# Patient Record
Sex: Male | Born: 1960 | Race: White | Hispanic: No | Marital: Married | Smoking: Never smoker
Health system: Southern US, Community
[De-identification: ages and names within clinical notes are randomized; demographics above are authoritative.]

## PROBLEM LIST (undated history)

## (undated) DIAGNOSIS — F1010 Alcohol abuse, uncomplicated: Secondary | ICD-10-CM

## (undated) DIAGNOSIS — E785 Hyperlipidemia, unspecified: Secondary | ICD-10-CM

## (undated) DIAGNOSIS — T7840XA Allergy, unspecified, initial encounter: Secondary | ICD-10-CM

## (undated) DIAGNOSIS — K649 Unspecified hemorrhoids: Secondary | ICD-10-CM

## (undated) DIAGNOSIS — K219 Gastro-esophageal reflux disease without esophagitis: Secondary | ICD-10-CM

## (undated) DIAGNOSIS — G4733 Obstructive sleep apnea (adult) (pediatric): Secondary | ICD-10-CM

## (undated) DIAGNOSIS — D649 Anemia, unspecified: Secondary | ICD-10-CM

## (undated) DIAGNOSIS — I10 Essential (primary) hypertension: Secondary | ICD-10-CM

## (undated) DIAGNOSIS — E119 Type 2 diabetes mellitus without complications: Secondary | ICD-10-CM

## (undated) HISTORY — DX: Essential (primary) hypertension: I10

## (undated) HISTORY — DX: Allergy, unspecified, initial encounter: T78.40XA

## (undated) HISTORY — DX: Type 2 diabetes mellitus without complications: E11.9

## (undated) HISTORY — DX: Gastro-esophageal reflux disease without esophagitis: K21.9

## (undated) HISTORY — PX: EYE SURGERY: SHX253

## (undated) HISTORY — DX: Hyperlipidemia, unspecified: E78.5

## (undated) HISTORY — PX: ESOPHAGOGASTRODUODENOSCOPY: SHX1529

## (undated) HISTORY — DX: Obstructive sleep apnea (adult) (pediatric): G47.33

## (undated) HISTORY — DX: Alcohol abuse, uncomplicated: F10.10

## (undated) HISTORY — DX: Anemia, unspecified: D64.9

## (undated) SURGERY — EGD (ESOPHAGOGASTRODUODENOSCOPY)
Anesthesia: Moderate Sedation

---

## 1999-02-11 ENCOUNTER — Encounter: Payer: Self-pay | Admitting: Gastroenterology

## 1999-02-11 ENCOUNTER — Ambulatory Visit (HOSPITAL_COMMUNITY): Admission: RE | Admit: 1999-02-11 | Discharge: 1999-02-11 | Payer: Self-pay | Admitting: Gastroenterology

## 2000-01-02 ENCOUNTER — Encounter: Admission: RE | Admit: 2000-01-02 | Discharge: 2000-01-02 | Payer: Self-pay | Admitting: Family Medicine

## 2000-01-02 ENCOUNTER — Encounter: Payer: Self-pay | Admitting: Family Medicine

## 2000-12-29 DIAGNOSIS — E119 Type 2 diabetes mellitus without complications: Secondary | ICD-10-CM

## 2000-12-29 HISTORY — PX: SEPTOPLASTY: SUR1290

## 2000-12-29 HISTORY — DX: Type 2 diabetes mellitus without complications: E11.9

## 2000-12-29 HISTORY — PX: KNEE SURGERY: SHX244

## 2005-06-19 ENCOUNTER — Encounter: Admission: RE | Admit: 2005-06-19 | Discharge: 2005-06-19 | Payer: Self-pay | Admitting: Family Medicine

## 2006-01-29 IMAGING — CT CT HEAD WO/W CM
1 of 2 series · 13 of 30 positions shown, 17 images · IV contrast (omnipaque)
Comparison: none

CLINICAL DATA: Dizziness, increased heart rate.  History of head injury in Thursday April, 2005 with loss of consciousness.
 HEAD CT WITHOUT AND WITH CONTRAST:
TECHNIQUE: 5 mm collimated images were obtained from the base of the skull through the vertex according to the standard protocol before and after administration of intravenous contrast.
 Contrast:  75 cc of Omnipaque 300.
 The ventricular system is normal in size and configuration, and the septum is in a normal midline position.  The fourth ventricle and basilar cisterns appear normal.  No acute intracranial abnormality is seen.  No mass effect is noted.  After contrast administration, no enhancing lesion is seen.  On bone window images, no acute bony abnormality is noted.

[Series 2: head w/o · axial · non-contrast · 0.45mm/px · z∈[+26,+147]mm · 13 of 28 slices shown, 17 images]
[im 2/28  brain]
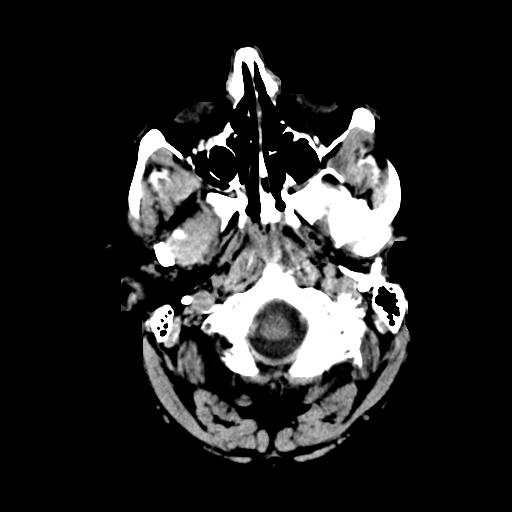
[im 2/28  bone]
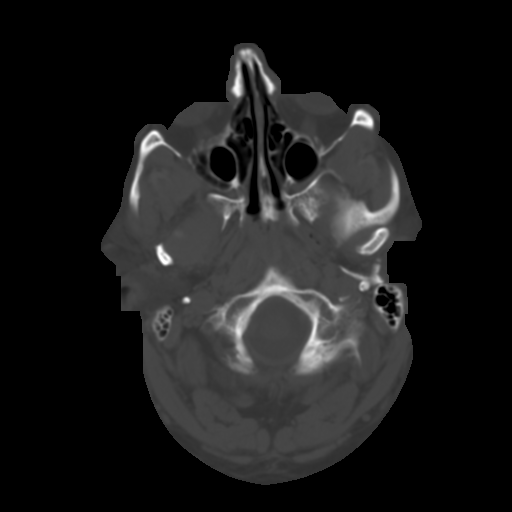
[im 4/28  brain]
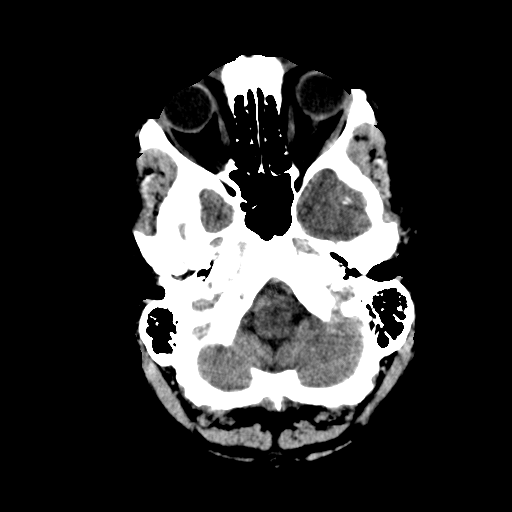
[im 6/28  brain]
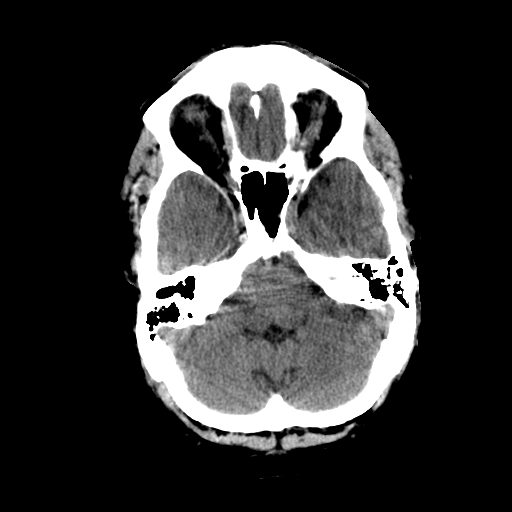
[im 8/28  brain]
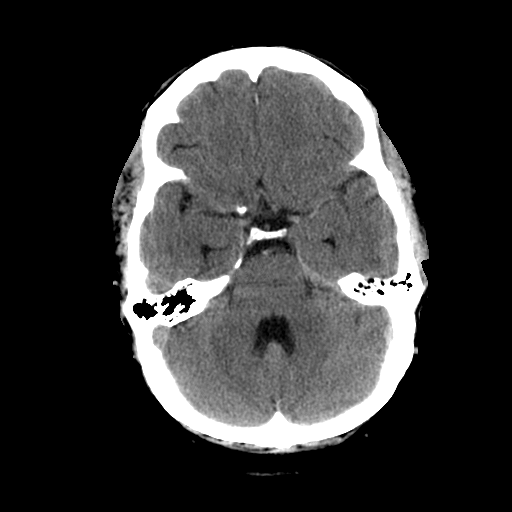
[im 10/28  brain]
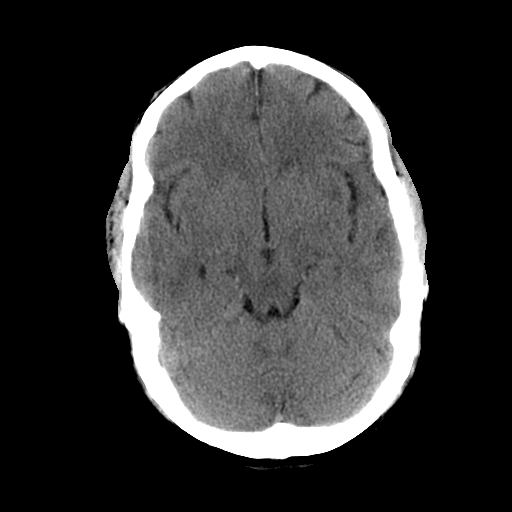
[im 10/28  bone]
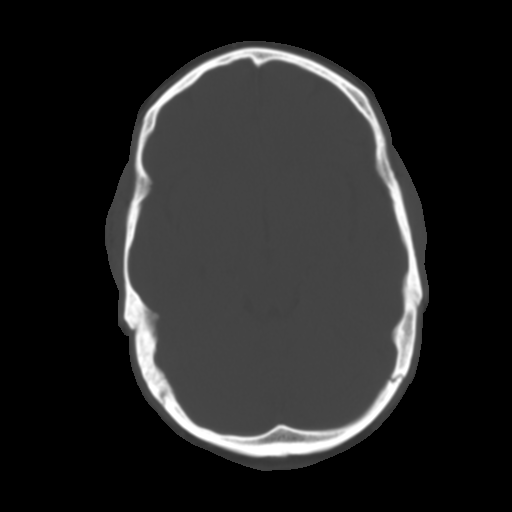
[im 12/28  brain]
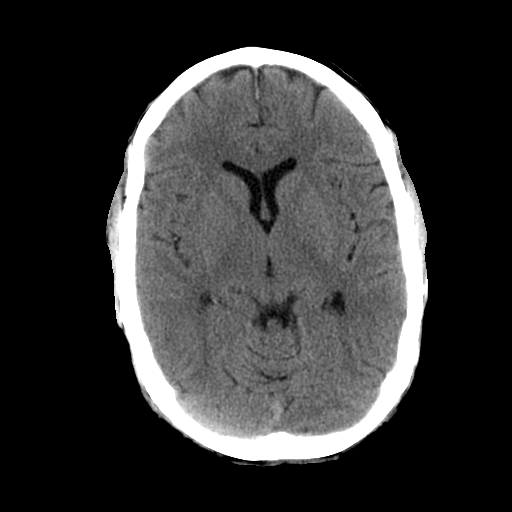
[im 14/28  brain]
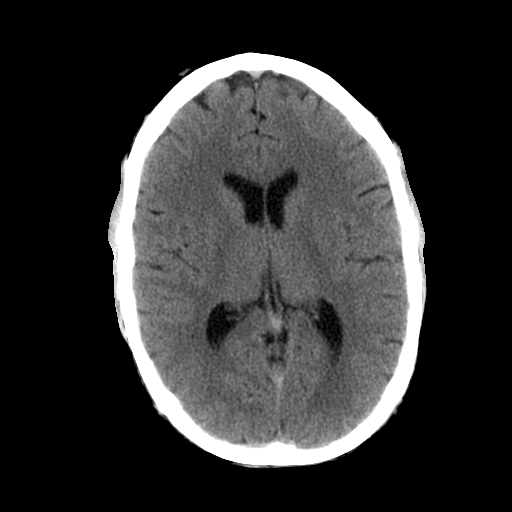
[im 16/28  brain]
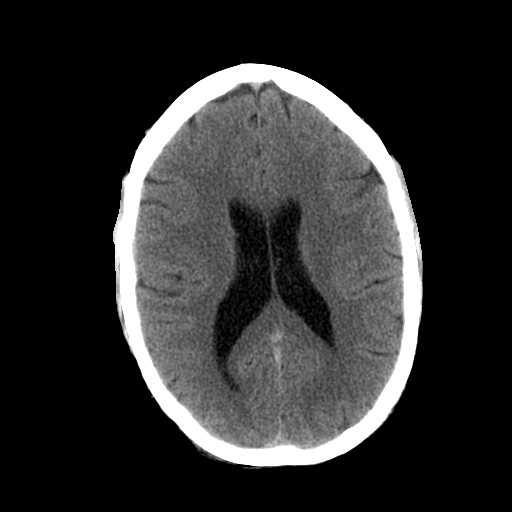
[im 18/28  brain]
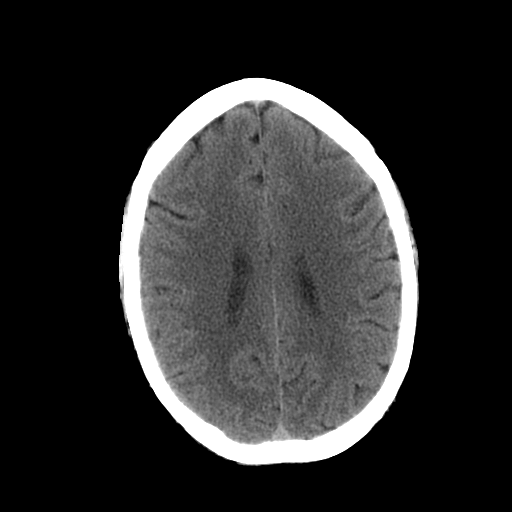
[im 18/28  bone]
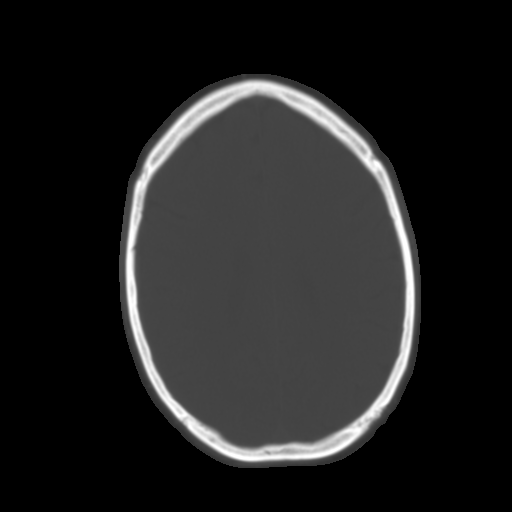
[im 20/28  brain]
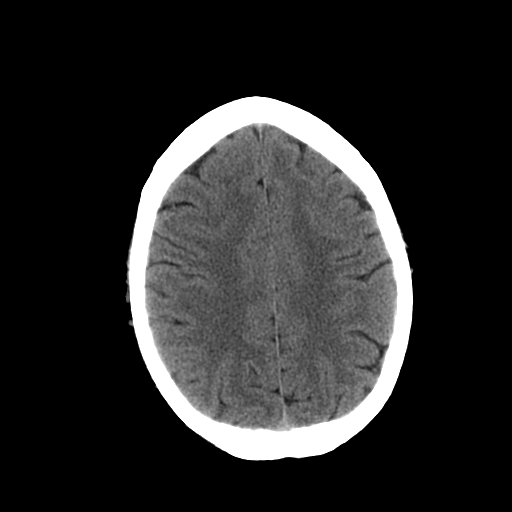
[im 22/28  brain]
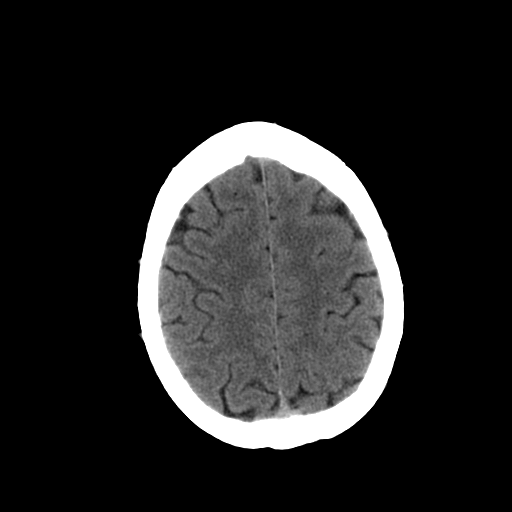
[im 24/28  brain]
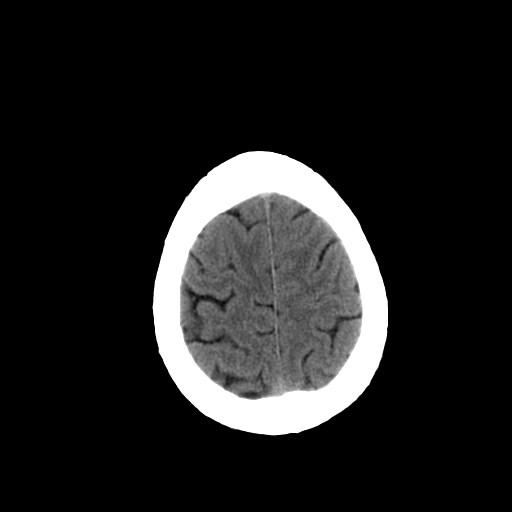
[im 26/28  brain]
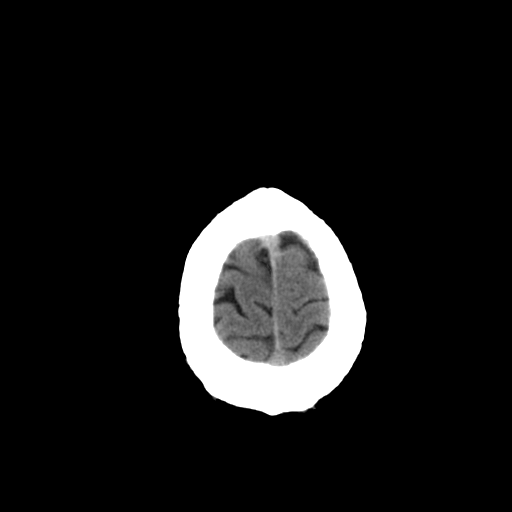
[im 26/28  bone]
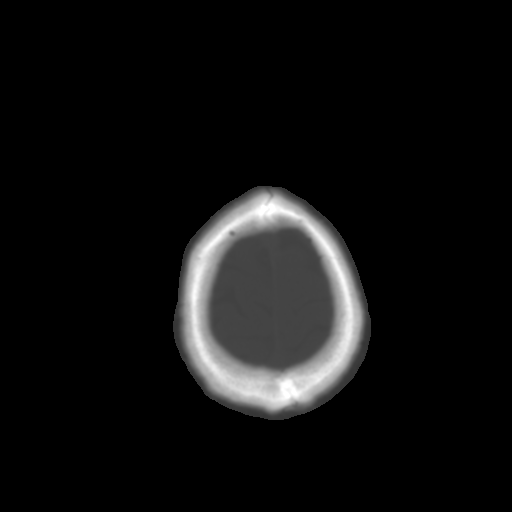

[13 of 30 positions shown; findings below may reference images not displayed]

IMPRESSION: Negative CT brain scan.

## 2008-01-11 ENCOUNTER — Encounter: Admission: RE | Admit: 2008-01-11 | Discharge: 2008-01-11 | Payer: Self-pay | Admitting: Sports Medicine

## 2008-08-22 IMAGING — RF DG FLUORO GUIDE NDL PLC/BX
1 series · 1 of 1 positions shown · IV contrast (magnevist)
Comparison: none

CLINICAL DATA: Shoulder pain.  Assess for labral pathology. 
 FLUOROSCOPICALLY GUIDED OF THE LEFT SHOULDER INJECTION FOR MR:
 The skin anterior to the left shoulder was cleansed and anesthetized.  A 22 gauge spinal needle was directed into the shoulder joint.  Intra-articular positioning was confirmed and 10 cc of fluid containing iodinated contrast, 1% Lidocaine, and a few drops of  Magnevist was injected.  The procedure was well-tolerated, and the patient was taken to MR in good condition.

[Series 2: (hospital) · 1 of 1 slices shown]
[im 1/1]
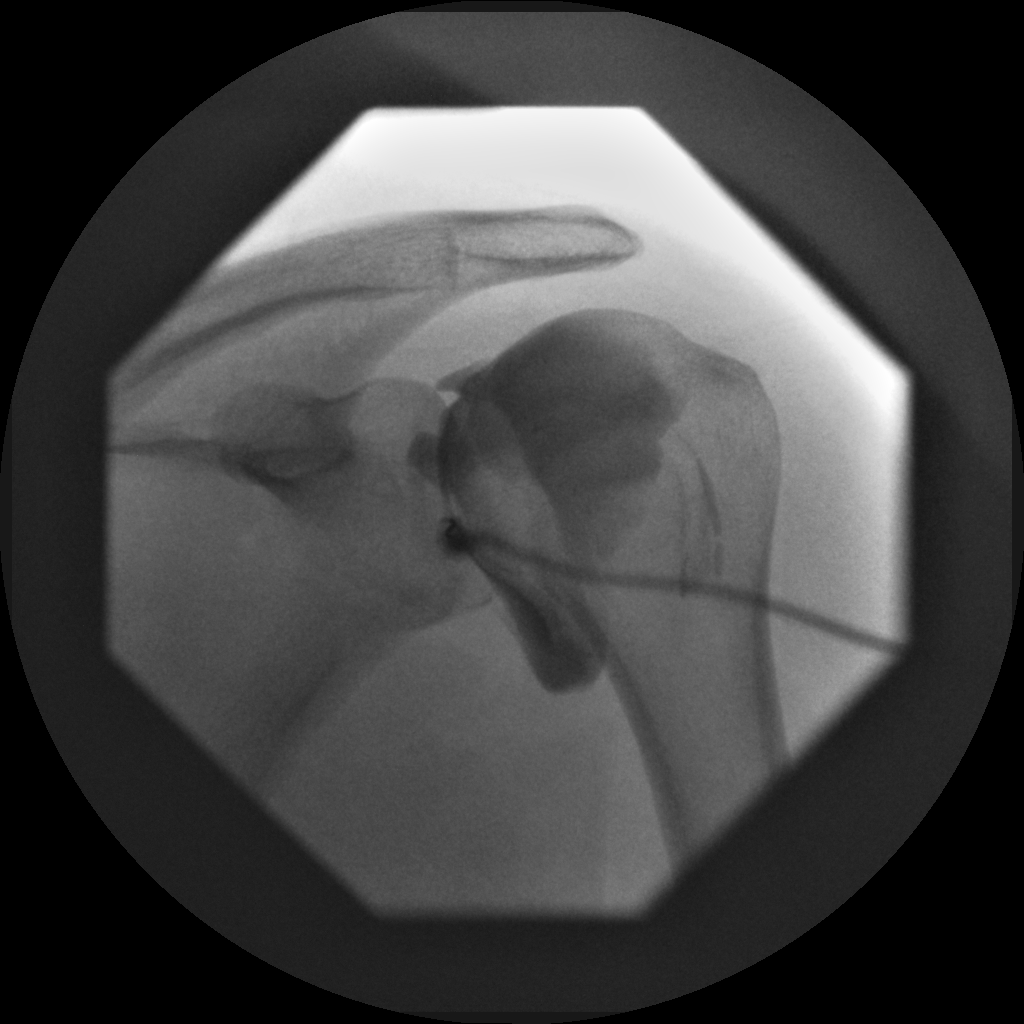

[1 of 1 positions shown; findings below may reference images not displayed]

IMPRESSION: Technically successful left shoulder injection for MRI.

## 2014-01-23 ENCOUNTER — Encounter (INDEPENDENT_AMBULATORY_CARE_PROVIDER_SITE_OTHER): Payer: Self-pay

## 2014-01-23 ENCOUNTER — Ambulatory Visit (INDEPENDENT_AMBULATORY_CARE_PROVIDER_SITE_OTHER): Payer: BC Managed Care – PPO | Admitting: General Surgery

## 2014-01-25 ENCOUNTER — Encounter (INDEPENDENT_AMBULATORY_CARE_PROVIDER_SITE_OTHER): Payer: Self-pay | Admitting: General Surgery

## 2014-01-25 ENCOUNTER — Telehealth (INDEPENDENT_AMBULATORY_CARE_PROVIDER_SITE_OTHER): Payer: Self-pay

## 2014-01-25 NOTE — Telephone Encounter (Signed)
Dr. Dulce Sellarutlaw made aware the patient cancelled his appt with Dr. Abbey Chattersosenbower and did not reschedule.

## 2014-03-07 ENCOUNTER — Encounter (INDEPENDENT_AMBULATORY_CARE_PROVIDER_SITE_OTHER): Payer: Self-pay

## 2014-05-04 ENCOUNTER — Ambulatory Visit (INDEPENDENT_AMBULATORY_CARE_PROVIDER_SITE_OTHER): Payer: BC Managed Care – PPO | Admitting: General Surgery

## 2014-05-04 ENCOUNTER — Encounter (INDEPENDENT_AMBULATORY_CARE_PROVIDER_SITE_OTHER): Payer: Self-pay | Admitting: General Surgery

## 2014-05-04 VITALS — BP 118/70 | HR 84 | Temp 97.3°F | Resp 14 | Ht 66.0 in | Wt 187.4 lb

## 2014-05-04 DIAGNOSIS — K648 Other hemorrhoids: Secondary | ICD-10-CM

## 2014-05-04 NOTE — Patient Instructions (Signed)
[facility] _______[facility] ° °RECTAL SURGERY POST OP INSTRUCTIONS: POST OP INSTRUCTIONS ° °Always review your discharge instruction sheet given to you by the facility where your surgery was performed. °IF YOU HAVE DISABILITY OR FAMILY LEAVE FORMS, YOU MUST BRING THEM TO THE OFFICE FOR PROCESSING.   °DO NOT GIVE THEM TO YOUR DOCTOR. ° °1. A  prescription for pain medication may be given to you upon discharge.  Take your pain medication as prescribed, if needed.  If narcotic pain medicine is not needed, then you may take acetaminophen (Tylenol) or ibuprofen (Advil) as needed. °2. Take your usually prescribed medications unless otherwise directed. °3. If you need a refill on your pain medication, please contact your pharmacy.  They will contact our office to request authorization. Prescriptions will not be filled after 5 pm or on week-ends. °4. You should follow a light diet the first 48 hours after arrival home, such as soup and crackers, etc.  Be sure to include lots of fluids daily.  Resume your normal diet 2-3 days after surgery.. °5. Most patients will experience some swelling and discomfort in the rectal area. Ice packs, reclining and warm tub soaks will help.  Swelling and discomfort can take several days to resolve.  °6. It is common to experience some constipation if taking pain medication after surgery.  Increasing fluid intake and taking a stool softener (such as Colace) will usually help or prevent this problem from occurring.  A mild laxative (Milk of Magnesia or Miralax) should be taken according to package directions if there are no bowel movements after 48 hours. °7. Unless discharge instructions indicate otherwise, leave your bandage dry and in place for 24 hours, or remove the bandage if you have a bowel movement. You may notice a small amount of bleeding with bowel movements for the first few days. You may have some packing in the rectum which will come out over the first day or two. You  will need to wear an absorbent pad or soft cotton gauze in your underwear until the drainage stops.it. °8. ACTIVITIES:  You may resume regular (light) daily activities beginning the next day--such as daily self-care, walking, climbing stairs--gradually increasing activities as tolerated.  You may have sexual intercourse when it is comfortable.  Refrain from any heavy lifting or straining until approved by your doctor. °a. You may drive when you are no longer taking prescription pain medication, you can comfortably wear a seatbelt, and you can safely maneuver your car and apply brakes. °b. RETURN TO WORK: : ____________________ °c.  °9. You should see your doctor in the office for a follow-up appointment approximately 2-3 weeks after your surgery.  Make sure that you call for this appointment within a day or two after you arrive home to insure a convenient appointment time. °10. OTHER INSTRUCTIONS:  __________________________________________________________________________________________________________________________________________________________________________________________  °WHEN TO CALL YOUR DOCTOR: °1. Fever over 101.0 °2. Inability to urinate °3. Nausea and/or vomiting °4. Extreme swelling or bruising °5. Continued bleeding from rectum. °6. Increased pain, redness, or drainage from the incision °7. Constipation ° °The clinic staff is available to answer your questions during regular business hours.  Please don’t hesitate to call and ask to speak to one of the nurses for clinical concerns.  If you have a medical emergency, go to the nearest emergency room or call 911.  A surgeon from [facility] is always on call at the hospital ° ° °[address], [address], [place], [place]  [address] ? °   [address] [address], [place], [place]   [address] °[phone] ? [phone] ? FAX [phone] °Web site: [email or web address] ° °

## 2014-05-04 NOTE — Progress Notes (Signed)
Patient ID: Raymond Hutchinson Nylen, male   DOB: [birth year], 53 y.o.   MRN: [id number]  Chief Complaint  Patient presents with  . New Evaluation    eval hems    HPI Raymond Hutchinson Squibb is a 53 y.o. male.   HPI  He is referred by Dr. Dulce Sellarutlaw because of prolapsing bleeding internal hemorrhoids. He has had an issue with this for about 5 years. He has to manually reduce the prolapsed hemorrhoid sometimes. The problem typically comes when he is straining to have a bowel movement. He does have intermittent bleeding. Colonoscopy has demonstrated enlarged internal hemorrhoids. She'Hutchinson tried medical treatment but the problem persists.  Past Medical History  Diagnosis Date  . Hypertension     Benign  . Hyperlipidemia   . GERD (gastroesophageal reflux disease)   . Allergy   . Obstructive sleep apnea   . Diabetes mellitus without complication 2002    AODM  . Anemia     iron deficiency  . Alcohol abuse     Past Surgical History  Procedure Laterality Date  . Septoplasty  2002  . Knee surgery Left 2002    arthroscopic  . Eye surgery      Family History  Problem Relation Age of Onset  . Heart disease Mother     CAD, PVD  . Hypertension Mother   . Colon polyps Father   . Cancer Maternal Grandfather     lung    Social History History  Substance Use Topics  . Smoking status: Never Smoker   . Smokeless tobacco: Never Used  . Alcohol Use: 2.4 - 4.2 oz/week    2-5 Cans of beer, 2 Shots of liquor per week     Comment: daily    Allergies  Allergen Reactions  . Penicillins Anaphylaxis    Current Outpatient Prescriptions  Medication Sig Dispense Refill  . allopurinol (ZYLOPRIM) 100 MG tablet Take 100 mg by mouth 2 (two) times daily.      Marland Kitchen. aspirin 81 MG tablet Take 81 mg by mouth daily.      Marland Kitchen. loratadine (CLARITIN) 10 MG tablet Take 10 mg by mouth daily.      . mometasone (NASONEX) 50 MCG/ACT nasal spray Place 2 sprays into the nose daily.      . Multiple Vitamin (MULTIVITAMIN) tablet  Take 1 tablet by mouth daily.      . Olmesartan-Amlodipine-HCTZ (TRIBENZOR) 40-10-25 MG TABS Take by mouth daily. 1/2 tablet daily.      Marland Kitchen. omeprazole (PRILOSEC OTC) 20 MG tablet Take 20 mg by mouth daily.      . simvastatin (ZOCOR) 20 MG tablet Take 20 mg by mouth at bedtime.      . Iron Combinations (IRON COMPLEX PO) Take by mouth as needed.       No current facility-administered medications for this visit.    Review of Systems Review of Systems  Constitutional: Negative.   Respiratory: Negative.   Cardiovascular: Negative.   Gastrointestinal: Positive for anal bleeding. Negative for abdominal pain.  Genitourinary: Negative.   Neurological: Negative.   Hematological: Negative.     Blood pressure 118/70, pulse 84, temperature 97.3 F (36.3 C), temperature source Temporal, resp. rate 14, height 5\' 6"  (1.676 m), weight 187 lb 6.4 oz (85.004 kg).  Physical Exam Physical Exam  Constitutional: No distress.  Overweight male, BMI 30.25  HENT:  Head: Normocephalic and atraumatic.  Cardiovascular: Normal rate and regular rhythm.   Pulmonary/Chest: Effort normal and breath sounds normal.  Abdominal:  Soft. He exhibits no mass. There is no tenderness.  Genitourinary:  No external anal lesions or fissures. No palpable mass on digital rectal exam.  Endoscopy was performed. This demonstrates a very large inflamed right posterior internal hemorrhoid that bleeds when touched. There is a moderate sized right anterior internal hemorrhoids. Left side appears normal. No masses.    Data Reviewed Note from Dr. Dulce Sellarutlaw. Colonoscopy report.  Assessment    Grade 3 bleeding internal hemorrhoids. Failed medical management. Not amenable to an office treatment.     Plan    We discussed hemorrhoidectomy and he is interested in this. I went over the procedure and risks in detail. We also discussed aftercare. Risks include but are not limited to bleeding, infection, wound problems, anesthesia,  incontinence, anal stenosis.        Jim Desanctisodd J Margerite Impastato 05/04/2014, 9:56 AM

## 2014-07-29 ENCOUNTER — Emergency Department (INDEPENDENT_AMBULATORY_CARE_PROVIDER_SITE_OTHER)
Admission: EM | Admit: 2014-07-29 | Discharge: 2014-07-29 | Disposition: A | Discharge status: Nursing Home (Includes ICF) | Payer: BC Managed Care – PPO | Source: Home / Self Care | Attending: Emergency Medicine | Admitting: Emergency Medicine

## 2014-07-29 ENCOUNTER — Encounter (HOSPITAL_COMMUNITY): Payer: Self-pay | Admitting: Emergency Medicine

## 2014-07-29 ENCOUNTER — Emergency Department (HOSPITAL_COMMUNITY)
Admission: EM | Admit: 2014-07-29 | Discharge: 2014-07-29 | Discharge status: Against Medical Advice | Payer: BC Managed Care – PPO | Attending: Emergency Medicine | Admitting: Emergency Medicine

## 2014-07-29 DIAGNOSIS — N183 Chronic kidney disease, stage 3 unspecified: Secondary | ICD-10-CM

## 2014-07-29 DIAGNOSIS — R5383 Other fatigue: Principal | ICD-10-CM

## 2014-07-29 DIAGNOSIS — R0989 Other specified symptoms and signs involving the circulatory and respiratory systems: Secondary | ICD-10-CM

## 2014-07-29 DIAGNOSIS — R5381 Other malaise: Secondary | ICD-10-CM

## 2014-07-29 DIAGNOSIS — R0609 Other forms of dyspnea: Secondary | ICD-10-CM

## 2014-07-29 DIAGNOSIS — E871 Hypo-osmolality and hyponatremia: Secondary | ICD-10-CM

## 2014-07-29 DIAGNOSIS — D649 Anemia, unspecified: Secondary | ICD-10-CM

## 2014-07-29 DIAGNOSIS — R0600 Dyspnea, unspecified: Secondary | ICD-10-CM

## 2014-07-29 HISTORY — DX: Unspecified hemorrhoids: K64.9

## 2014-07-29 LAB — POCT I-STAT, CHEM 8
BUN: 19 mg/dL (ref 6–23)
CHLORIDE: 97 meq/L (ref 96–112)
Calcium, Ion: 1.03 mmol/L — ABNORMAL LOW (ref 1.12–1.23)
Creatinine, Ser: 1.7 mg/dL — ABNORMAL HIGH (ref 0.50–1.35)
GLUCOSE: 92 mg/dL (ref 70–99)
HCT: 33 % — ABNORMAL LOW (ref 39.0–52.0)
Hemoglobin: 11.2 g/dL — ABNORMAL LOW (ref 13.0–17.0)
Potassium: 4.1 mEq/L (ref 3.7–5.3)
Sodium: 130 mEq/L — ABNORMAL LOW (ref 137–147)
TCO2: 20 mmol/L (ref 0–100)

## 2014-07-29 NOTE — ED Notes (Signed)
The pt reports that he is feeling better and he has waited long enough he will return if his problem increases

## 2014-07-29 NOTE — ED Notes (Signed)
C/o SOB  onset last night. C/o extreme fatigue for 1 week.  He got his sleep cycle off during his wife's back surgery.  Sleeping 18-20 hrs day.  He fell out of be twice Tues. and Wed. night. Wakes up on floor and clutching c-Pap machine.  Has elevated liver enzymes from drinking.  Drinking less due to sleeping more.  No chest pain, nausea or sweating, headache or fever.

## 2014-07-29 NOTE — ED Notes (Addendum)
Pt is an [facility] transfer.  Pt with c/o extreme fatigue x 5 days.  Pt sent here for workup of hyponatremia and concern for kidney failure.  Pt also drinks daily with last drink this afternoon 1500.  Pt reports he drinks at least 6 drinks daily.  [facility]

## 2014-07-29 NOTE — Discharge Instructions (Signed)
We have determined that your problem requires further evaluation in the emergency department.  We will take care of your transport there.  Once at the emergency department, you will be evaluated by a provider and they will order whatever treatment or tests they deem necessary.  We cannot guarantee that they will do any specific test or do any specific treatment.  ° °

## 2014-07-29 NOTE — ED Provider Notes (Signed)
Chief Complaint   Chief Complaint  Patient presents with  . Shortness of Breath    History of Present Illness   Raymond Hutchinson is a 53 year old male who presents tonight with a 5 day history of extreme fatigue, exhaustion, and sleeping up to 20 hours per day. He's also intermittently short of breath. This comes both at rest and with exercise. The shortness of breath has not been associated with any chest pain, tightness, or pressure. He denies any coughing, wheezing, or asthma history. The patient states he fell out of bed 2 nights in a row and has an abrasion on his forehead. He has a history of sleep apnea and is on CPAP. He admits to drinking 4-5 drinks per day. He's had elevated liver tests and is seeing a gastroenterologist for that. As far as he knows his kidney functions have been normal. He states his sodium has always been somewhat low. He denies fevers, headache, blurry vision, abdominal pain, nausea, vomiting, diarrhea, melena, hematochezia, urinary symptoms, or extremity pain, paresthesias, or swelling.  Review of Systems   Other than as noted above, the patient denies any of the following symptoms. Systemic:  No fever or chills. ENT:  No nasal congestion, rhinorrhea, or sore throat. Pulmonary:  No cough, wheezing, sputum production, hemoptysis. Cardiac:  No chest pain, palpitations, rapid heartbeat, dizziness, presyncope or syncope. Skin:  No rash or itching. Ext:  No leg pain or swelling. Psych:  No anxiety or depression.  PMFSH   Past medical history, family history, social history, meds, and allergies were reviewed. Is allergic to penicillin. He has a history of high blood pressure, gout, reflux esophagitis, elevated cholesterol, and elevated liver function tests. Current meds include simvastatin, Prilosec, allopurinol, and Tribenzor.  Physical Examination    Vital signs:  BP 149/70  Pulse 88  Temp(Src) 98.1 F (36.7 C) (Oral)  Resp 24  SpO2 97% Gen:  Alert,  oriented, in no distress, skin warm and dry. He has an odor of alcohol on his breath. Eye:  PERRL, lids and conjunctivas normal.  Sclera non-icteric. ENT:  Mucous membranes moist, pharynx clear. There is a healing abrasion on his forehead. Neck:  Supple, no adenopathy or tenderness.  No JVD. Lungs:  Clear to auscultation and percussion, no wheezes, rales or rhonchi.  No respiratory distress. Heart:  Regular rhythm.  No gallops, murmers, clicks or rubs. Abdomen:  Soft, nontender, no organomegaly or mass.  Bowel sounds normal.  No pulsatile abdominal mass or bruit. Ext:  No edema.  No calf tenderness and Homann's sign negative.  Pulses full and equal. Skin:  Warm and dry.  No rash.  Labs   Results for orders placed during the hospital encounter of 07/29/14  POCT I-STAT, CHEM 8      Result Value Ref Range   Sodium 130 (*) 137 - 147 mEq/L   Potassium 4.1  3.7 - 5.3 mEq/L   Chloride 97  96 - 112 mEq/L   BUN 19  6 - 23 mg/dL   Creatinine, Ser 1.61 (*) 0.50 - 1.35 mg/dL   Glucose, Bld 92  70 - 99 mg/dL   Calcium, Ion 0.96 (*) 1.12 - 1.23 mmol/L   TCO2 20  0 - 100 mmol/L   Hemoglobin 11.2 (*) 13.0 - 17.0 g/dL   HCT 04.5 (*) 40.9 - 81.1 %    EKG Results:  Date: 07/29/2014  Rate: 88  Rhythm: normal sinus rhythm  QRS Axis: normal--59  Intervals: normal  ST/T Wave  abnormalities: normal  Conduction Disutrbances:none  Narrative Interpretation: Normal sinus rhythm, normal EKG.  Old EKG Reviewed: none available   Assessment   The primary encounter diagnosis was Other fatigue. Diagnoses of Dyspnea, Chronic kidney disease, stage 3 (moderate), Hyponatremia, and Anemia, unspecified anemia type were also pertinent to this visit.  I am concerned about acute on chronic kidney disease, also concerned about possible withdrawal from alcohol. He is at high risk for delirium tremens. I think he will need gentle rehydration with close monitoring of electrolytes, creatinine, and BUN, as well as treatment  for alcohol withdrawal.  Plan    The patient was transferred to the ED via shuttle in stable condition.  Medical Decision Making:  53 year old male presents with 5 day history of extreme fatigue and episodes of shortness of breath.  He states he's sleeping 20 hours a day.  No chest or abdominal pain.  On exam his lungs are clear and abdomen is benign.  His EKG is WNL.  His iStat 8 reveals Cr 1.7, Na 130, and Hg 11.2. (No baseline values for comparison.) Has a history of OSA and elevated liver tests.  Admits to drinking 4 to 5 drinks per day.  Has odor of alcohol on breath.  My impression is acute on chronic kidney disease.  I feel he needs gentle rehydration with close follow up of Na. Additionally, I am concerned about him withdrawing from alcohol, he is a high risk of DTs.         Reuben Likesavid C Mitcheal Sweetin, MD 07/29/14 2022

## 2014-07-31 ENCOUNTER — Inpatient Hospital Stay (HOSPITAL_COMMUNITY)
Admission: EM | Admit: 2014-07-31 | Discharge: 2014-08-04 | DRG: 394 | Disposition: A | Discharge status: Home / Self Care | Payer: BC Managed Care – PPO | Attending: Internal Medicine | Admitting: Internal Medicine

## 2014-07-31 ENCOUNTER — Encounter (HOSPITAL_COMMUNITY): Payer: Self-pay | Admitting: Emergency Medicine

## 2014-07-31 DIAGNOSIS — K648 Other hemorrhoids: Secondary | ICD-10-CM

## 2014-07-31 DIAGNOSIS — F10230 Alcohol dependence with withdrawal, uncomplicated: Secondary | ICD-10-CM

## 2014-07-31 DIAGNOSIS — E871 Hypo-osmolality and hyponatremia: Secondary | ICD-10-CM | POA: Diagnosis present

## 2014-07-31 DIAGNOSIS — R5383 Other fatigue: Secondary | ICD-10-CM

## 2014-07-31 DIAGNOSIS — K2100 Gastro-esophageal reflux disease with esophagitis, without bleeding: Secondary | ICD-10-CM

## 2014-07-31 DIAGNOSIS — F1020 Alcohol dependence, uncomplicated: Secondary | ICD-10-CM | POA: Diagnosis present

## 2014-07-31 DIAGNOSIS — I10 Essential (primary) hypertension: Secondary | ICD-10-CM | POA: Diagnosis present

## 2014-07-31 DIAGNOSIS — F10939 Alcohol use, unspecified with withdrawal, unspecified: Secondary | ICD-10-CM | POA: Diagnosis present

## 2014-07-31 DIAGNOSIS — K2920 Alcoholic gastritis without bleeding: Secondary | ICD-10-CM | POA: Diagnosis present

## 2014-07-31 DIAGNOSIS — K649 Unspecified hemorrhoids: Secondary | ICD-10-CM | POA: Diagnosis present

## 2014-07-31 DIAGNOSIS — E785 Hyperlipidemia, unspecified: Secondary | ICD-10-CM | POA: Diagnosis present

## 2014-07-31 DIAGNOSIS — F10239 Alcohol dependence with withdrawal, unspecified: Secondary | ICD-10-CM | POA: Diagnosis present

## 2014-07-31 DIAGNOSIS — Z8371 Family history of colonic polyps: Secondary | ICD-10-CM

## 2014-07-31 DIAGNOSIS — Z83719 Family history of colon polyps, unspecified: Secondary | ICD-10-CM

## 2014-07-31 DIAGNOSIS — Z79899 Other long term (current) drug therapy: Secondary | ICD-10-CM

## 2014-07-31 DIAGNOSIS — F10930 Alcohol use, unspecified with withdrawal, uncomplicated: Secondary | ICD-10-CM

## 2014-07-31 DIAGNOSIS — R5381 Other malaise: Secondary | ICD-10-CM | POA: Diagnosis present

## 2014-07-31 DIAGNOSIS — D62 Acute posthemorrhagic anemia: Secondary | ICD-10-CM | POA: Diagnosis present

## 2014-07-31 DIAGNOSIS — K2960 Other gastritis without bleeding: Secondary | ICD-10-CM | POA: Diagnosis present

## 2014-07-31 DIAGNOSIS — K219 Gastro-esophageal reflux disease without esophagitis: Secondary | ICD-10-CM | POA: Diagnosis present

## 2014-07-31 DIAGNOSIS — D649 Anemia, unspecified: Secondary | ICD-10-CM | POA: Diagnosis present

## 2014-07-31 DIAGNOSIS — G4733 Obstructive sleep apnea (adult) (pediatric): Secondary | ICD-10-CM

## 2014-07-31 DIAGNOSIS — F1010 Alcohol abuse, uncomplicated: Secondary | ICD-10-CM | POA: Diagnosis present

## 2014-07-31 DIAGNOSIS — Z8711 Personal history of peptic ulcer disease: Secondary | ICD-10-CM

## 2014-07-31 DIAGNOSIS — Z6830 Body mass index (BMI) 30.0-30.9, adult: Secondary | ICD-10-CM

## 2014-07-31 DIAGNOSIS — D500 Iron deficiency anemia secondary to blood loss (chronic): Secondary | ICD-10-CM | POA: Diagnosis present

## 2014-07-31 DIAGNOSIS — E119 Type 2 diabetes mellitus without complications: Secondary | ICD-10-CM

## 2014-07-31 DIAGNOSIS — K709 Alcoholic liver disease, unspecified: Secondary | ICD-10-CM | POA: Diagnosis present

## 2014-07-31 DIAGNOSIS — K449 Diaphragmatic hernia without obstruction or gangrene: Secondary | ICD-10-CM | POA: Diagnosis present

## 2014-07-31 DIAGNOSIS — Z7982 Long term (current) use of aspirin: Secondary | ICD-10-CM

## 2014-07-31 LAB — COMPREHENSIVE METABOLIC PANEL
ALBUMIN: 4.2 g/dL (ref 3.5–5.2)
ALK PHOS: 64 U/L (ref 39–117)
ALT: 127 U/L — ABNORMAL HIGH (ref 0–53)
ANION GAP: 22 — AB (ref 5–15)
AST: 169 U/L — ABNORMAL HIGH (ref 0–37)
BUN: 11 mg/dL (ref 6–23)
CO2: 19 mEq/L (ref 19–32)
CREATININE: 0.83 mg/dL (ref 0.50–1.35)
Calcium: 9.1 mg/dL (ref 8.4–10.5)
Chloride: 85 mEq/L — ABNORMAL LOW (ref 96–112)
GFR calc non Af Amer: >90 mL/min (ref >90)
GLUCOSE: 114 mg/dL — AB (ref 70–99)
POTASSIUM: 3.7 meq/L (ref 3.7–5.3)
Sodium: 126 mEq/L — ABNORMAL LOW (ref 137–147)
TOTAL PROTEIN: 7.5 g/dL (ref 6.0–8.3)
Total Bilirubin: 0.5 mg/dL (ref 0.3–1.2)

## 2014-07-31 LAB — CBC WITH DIFFERENTIAL/PLATELET
BASOS ABS: 0 10*3/uL (ref 0.0–0.1)
BASOS PCT: 0 % (ref 0–1)
Eosinophils Absolute: 0.1 10*3/uL (ref 0.0–0.7)
Eosinophils Relative: 1 % (ref 0–5)
HEMATOCRIT: 27.3 % — AB (ref 39.0–52.0)
HEMOGLOBIN: 8.2 g/dL — AB (ref 13.0–17.0)
LYMPHS ABS: 1 10*3/uL (ref 0.7–4.0)
LYMPHS PCT: 18 % (ref 12–46)
MCH: 20 pg — ABNORMAL LOW (ref 26.0–34.0)
MCHC: 30 g/dL (ref 30.0–36.0)
MCV: 66.4 fL — AB (ref 78.0–100.0)
Monocytes Absolute: 0.6 10*3/uL (ref 0.1–1.0)
Monocytes Relative: 11 % (ref 3–12)
NEUTROS ABS: 3.8 10*3/uL (ref 1.7–7.7)
Neutrophils Relative %: 70 % (ref 43–77)
Platelets: 234 10*3/uL (ref 150–400)
RBC: 4.11 MIL/uL — ABNORMAL LOW (ref 4.22–5.81)
RDW: 17.1 % — AB (ref 11.5–15.5)
WBC: 5.5 10*3/uL (ref 4.0–10.5)

## 2014-07-31 MED ORDER — SODIUM CHLORIDE 0.9 % IJ SOLN
3.0000 mL | Freq: Two times a day (BID) | INTRAMUSCULAR | Status: DC
  Administered 2014-07-31 – 2014-08-01 (×3): 3 mL via INTRAVENOUS

## 2014-07-31 MED ORDER — OMEPRAZOLE MAGNESIUM 20 MG PO TBEC: 20.0000 mg | DELAYED_RELEASE_TABLET | Freq: Every day | ORAL | Status: DC

## 2014-07-31 MED ORDER — FLUTICASONE PROPIONATE 50 MCG/ACT NA SUSP
2.0000 | Freq: Every day | NASAL | Status: DC
  Filled 2014-07-31: qty 16

## 2014-07-31 MED ORDER — SODIUM CHLORIDE 0.9 % IV SOLN: INTRAVENOUS | Status: DC

## 2014-07-31 MED ORDER — THIAMINE HCL 100 MG/ML IJ SOLN
100.0000 mg | Freq: Every day | INTRAMUSCULAR | Status: DC
  Filled 2014-07-31 (×4): qty 1

## 2014-07-31 MED ORDER — LORAZEPAM 1 MG PO TABS
1.0000 mg | ORAL_TABLET | Freq: Four times a day (QID) | ORAL | Status: AC | PRN
  Administered 2014-08-01 (×2): 1 mg via ORAL
  Filled 2014-07-31: qty 1

## 2014-07-31 MED ORDER — LORATADINE 10 MG PO TABS
10.0000 mg | ORAL_TABLET | Freq: Every day | ORAL | Status: DC
  Administered 2014-08-01 – 2014-08-03 (×3): 10 mg via ORAL
  Filled 2014-07-31 (×4): qty 1

## 2014-07-31 MED ORDER — SIMVASTATIN 20 MG PO TABS
20.0000 mg | ORAL_TABLET | Freq: Every day | ORAL | Status: DC
  Administered 2014-08-01: 20 mg via ORAL
  Filled 2014-07-31 (×2): qty 1

## 2014-07-31 MED ORDER — VITAMIN B-1 100 MG PO TABS
100.0000 mg | ORAL_TABLET | Freq: Every day | ORAL | Status: DC
  Administered 2014-08-01 – 2014-08-03 (×3): 100 mg via ORAL
  Filled 2014-07-31 (×4): qty 1

## 2014-07-31 MED ORDER — LORAZEPAM 2 MG/ML IJ SOLN: 1.0000 mg | Freq: Four times a day (QID) | INTRAMUSCULAR | Status: AC | PRN

## 2014-07-31 MED ORDER — ASPIRIN 81 MG PO TABS: 81.0000 mg | ORAL_TABLET | Freq: Every day | ORAL | Status: DC

## 2014-07-31 MED ORDER — ADULT MULTIVITAMIN W/MINERALS CH
1.0000 | ORAL_TABLET | Freq: Every day | ORAL | Status: DC
  Administered 2014-08-01 – 2014-08-03 (×3): 1 via ORAL
  Filled 2014-07-31 (×4): qty 1

## 2014-07-31 MED ORDER — SODIUM CHLORIDE 0.9 % IV SOLN
INTRAVENOUS | Status: DC
  Administered 2014-07-31 – 2014-08-04 (×7): via INTRAVENOUS

## 2014-07-31 MED ORDER — FOLIC ACID 1 MG PO TABS
1.0000 mg | ORAL_TABLET | Freq: Every day | ORAL | Status: DC
  Administered 2014-08-01 – 2014-08-03 (×3): 1 mg via ORAL
  Filled 2014-07-31 (×4): qty 1

## 2014-07-31 MED ORDER — AMLODIPINE BESYLATE 5 MG PO TABS
5.0000 mg | ORAL_TABLET | Freq: Every day | ORAL | Status: DC
  Administered 2014-08-01 – 2014-08-03 (×3): 5 mg via ORAL
  Filled 2014-07-31 (×4): qty 1

## 2014-07-31 MED ORDER — SODIUM CHLORIDE 0.9 % IV BOLUS (SEPSIS)
1000.0000 mL | Freq: Once | INTRAVENOUS | Status: AC
  Administered 2014-07-31: 1000 mL via INTRAVENOUS

## 2014-07-31 MED ORDER — ALLOPURINOL 100 MG PO TABS
100.0000 mg | ORAL_TABLET | Freq: Two times a day (BID) | ORAL | Status: DC
  Administered 2014-08-01 – 2014-08-03 (×7): 100 mg via ORAL
  Filled 2014-07-31 (×10): qty 1

## 2014-07-31 MED ORDER — IRBESARTAN 150 MG PO TABS
150.0000 mg | ORAL_TABLET | Freq: Every day | ORAL | Status: DC
  Administered 2014-08-01 – 2014-08-03 (×3): 150 mg via ORAL
  Filled 2014-07-31 (×4): qty 1

## 2014-07-31 NOTE — ED Notes (Signed)
Dr. Jacubowitz at bedside 

## 2014-07-31 NOTE — ED Notes (Signed)
Pt. advised by his PCP to go to ER due to low HGB = 8.6 , Na = 126 , denies pain or discomfort /respirations unlabored .

## 2014-07-31 NOTE — ED Provider Notes (Signed)
CSN: [id number]     Arrival date & time 07/31/14  2013 History   None    No chief complaint on file.    (Consider location/radiation/quality/duration/timing/severity/associated sxs/prior Treatment) HPI Complains of generalized weakness for the past 2 weeks. He saw Dr. Valentina Lucks in the office earlier today. Is determined to have hyponatremia and anemia. Sent here for further evaluation and treatment. He admits to several episodes of vomiting over the past 2 weeks. And frequent hiccups. He denies abdominal pain. He is presently asymptomatic except for generalized weakness. No other associated symptoms. No blood per rectum. Past Medical History  Diagnosis Date  . Hypertension     Benign  . Hyperlipidemia   . GERD (gastroesophageal reflux disease)   . Allergy   . Obstructive sleep apnea   . Diabetes mellitus without complication 2002    AODM  . Anemia     iron deficiency  . Alcohol abuse   . Hemorrhoids    Past Surgical History  Procedure Laterality Date  . Septoplasty  2002  . Knee surgery Left 2002    arthroscopic  . Eye surgery     Family History  Problem Relation Age of Onset  . Heart disease Mother     CAD, PVD  . Hypertension Mother   . Colon polyps Father   . Cancer Maternal Grandfather     lung   History  Substance Use Topics  . Smoking status: Never Smoker   . Smokeless tobacco: Never Used  . Alcohol Use: 3.6 - 4.8 oz/week    5-6 Shots of liquor, 1-2 Cans of beer per week     Comment: daily   patient drinks 8 glasses of bourbon per day. Has had increased alcohol intake over the past 6 weeks.  Review of Systems  Gastrointestinal: Positive for nausea and vomiting.  Skin: Positive for wound.       Abrasion of forehead where he fell out of bed several days ago  Neurological: Positive for weakness.  All other systems reviewed and are negative.     Allergies  Penicillins  Home Medications   Prior to Admission medications   Medication Sig Start Date End  Date Taking? Authorizing Provider  allopurinol (ZYLOPRIM) 100 MG tablet Take 100 mg by mouth 2 (two) times daily.    Historical Provider, MD  aspirin 81 MG tablet Take 81 mg by mouth daily.    Historical Provider, MD  Iron Combinations (IRON COMPLEX PO) Take by mouth as needed.    Historical Provider, MD  loratadine (CLARITIN) 10 MG tablet Take 10 mg by mouth daily.    Historical Provider, MD  mometasone (NASONEX) 50 MCG/ACT nasal spray Place 2 sprays into the nose daily.    Historical Provider, MD  Multiple Vitamin (MULTIVITAMIN) tablet Take 1 tablet by mouth daily.    Historical Provider, MD  Olmesartan-Amlodipine-HCTZ (TRIBENZOR) 40-10-25 MG TABS Take by mouth daily. 1/2 tablet daily.    Historical Provider, MD  omeprazole (PRILOSEC OTC) 20 MG tablet Take 20 mg by mouth daily.    Historical Provider, MD  simvastatin (ZOCOR) 20 MG tablet Take 20 mg by mouth at bedtime.    Historical Provider, MD   BP 143/76  Pulse 111  Temp(Src) 98 F (36.7 C) (Oral)  Resp 20  Ht 5\' 6"  (1.676 m)  Wt 187 lb (84.823 kg)  BMI 30.20 kg/m2  SpO2 97% Physical Exam  Nursing note and vitals reviewed. Constitutional: He appears well-developed and well-nourished. No distress.  HENT:  Right Ear: External ear normal.  Left Ear: External ear normal.  Dime-sized well-healing abrasion over her forehead otherwise normocephalic atraumatic  Eyes: Conjunctivae are normal. Pupils are equal, round, and reactive to light.  Neck: Neck supple. No tracheal deviation present. No thyromegaly present.  Cardiovascular: Normal rate and regular rhythm.   No murmur heard. Pulmonary/Chest: Effort normal and breath sounds normal.  Abdominal: Soft. Bowel sounds are normal. He exhibits no distension. There is no tenderness.  Genitourinary: Rectum normal. Guaiac negative stool.  Musculoskeletal: Normal range of motion. He exhibits no edema and no tenderness.  Neurological: He is alert. No cranial nerve deficit. Coordination normal.   Skin: Skin is warm and dry. No rash noted.  Psychiatric: He has a normal mood and affect.    ED Course  Procedures (including critical care time) Labs Review Labs Reviewed  CBC WITH DIFFERENTIAL - Abnormal; Notable for the following:    RBC 4.11 (*)    Hemoglobin 8.2 (*)    HCT 27.3 (*)    MCV 66.4 (*)    MCH 20.0 (*)    RDW 17.1 (*)    All other components within normal limits  COMPREHENSIVE METABOLIC PANEL   Results for orders placed during the hospital encounter of 07/31/14  CBC WITH DIFFERENTIAL      Result Value Ref Range   WBC 5.5  4.0 - 10.5 K/uL   RBC 4.11 (*) 4.22 - 5.81 MIL/uL   Hemoglobin 8.2 (*) 13.0 - 17.0 g/dL   HCT 16.1 (*) 09.6 - 04.5 %   MCV 66.4 (*) 78.0 - 100.0 fL   MCH 20.0 (*) 26.0 - 34.0 pg   MCHC 30.0  30.0 - 36.0 g/dL   RDW 40.9 (*) 81.1 - 91.4 %   Platelets 234  150 - 400 K/uL   Neutrophils Relative % 70  43 - 77 %   Lymphocytes Relative 18  12 - 46 %   Monocytes Relative 11  3 - 12 %   Eosinophils Relative 1  0 - 5 %   Basophils Relative 0  0 - 1 %   Neutro Abs 3.8  1.7 - 7.7 K/uL   Lymphs Abs 1.0  0.7 - 4.0 K/uL   Monocytes Absolute 0.6  0.1 - 1.0 K/uL   Eosinophils Absolute 0.1  0.0 - 0.7 K/uL   Basophils Absolute 0.0  0.0 - 0.1 K/uL   RBC Morphology POLYCHROMASIA PRESENT    COMPREHENSIVE METABOLIC PANEL      Result Value Ref Range   Sodium 126 (*) 137 - 147 mEq/L   Potassium 3.7  3.7 - 5.3 mEq/L   Chloride 85 (*) 96 - 112 mEq/L   CO2 19  19 - 32 mEq/L   Glucose, Bld 114 (*) 70 - 99 mg/dL   BUN 11  6 - 23 mg/dL   Creatinine, Ser 7.82  0.50 - 1.35 mg/dL   Calcium 9.1  8.4 - 95.6 mg/dL   Total Protein 7.5  6.0 - 8.3 g/dL   Albumin 4.2  3.5 - 5.2 g/dL   AST 213 (*) 0 - 37 U/L   ALT 127 (*) 0 - 53 U/L   Alkaline Phosphatase 64  39 - 117 U/L   Total Bilirubin 0.5  0.3 - 1.2 mg/dL   GFR calc non Af Amer >90  >90 mL/min   GFR calc Af Amer >90  >90 mL/min   Anion gap 22 (*) 5 - 15   No results found.  Imaging  Review No results  found.   EKG Interpretation None      MDM  Spoke with Dr. Julian ReilGardner plan admit telemetry. Watch for alcohol withdrawal. CIWA protocol Final diagnoses:  None   Diagnosis #1 hyponatremia #2 anemia #3 weakness #4 alcohol abuse     Doug SouSam Eric Morganti, MD 07/31/14 2220

## 2014-07-31 NOTE — H&P (Signed)
[facility] History and Physical  Raymond Hutchinson ZOX:[id number] DOB: Oct 03, 1961 DOA: 07/31/2014  Referring physician: EDP PCP: Lupita Raider, MD   Chief Complaint: Generalized weakness   HPI: Raymond Hutchinson is a 53 y.o. male who presents to the ED with c/o generalized weakness and fatigue for the past 2 weeks.  He saw Dr. Valentina Lucks in the office today and was found to have hyponatremia and anemia on labs before being sent in to the ED.  While he has had several episodes of vomiting over the past 2 weeks he has had no stigmata of GI bleed, and his guiac was negative today in ED.  He does have chronic internal hemorrhoids which have bled profusely in past months.  He does admit to ongoing EtOH use, very heavy he states and is trying to quit.  He does admit to drinking 4-8 glasses of water a day recently.  He has had tremors when he stops drinking for more than 6 hours in the past he states.  He also has known elevated LFTs for the past month which was diagnosed as "fatty liver" and he was recommended to lose weight (which he did).  Does not have a known diagnosis of EtOH cirrhosis.  Work up in ED shows HGB of 8.2 (was 11.2 2 days ago but this was on an I-Stat machine and he has had no stigmata of GIB or changes in the interim), sodium 126.  Review of Systems: Systems reviewed.  As above, otherwise negative  Past Medical History  Diagnosis Date  . Hypertension     Benign  . Hyperlipidemia   . GERD (gastroesophageal reflux disease)   . Allergy   . Obstructive sleep apnea   . Diabetes mellitus without complication 2002    AODM  . Anemia     iron deficiency  . Alcohol abuse   . Hemorrhoids    Past Surgical History  Procedure Laterality Date  . Septoplasty  2002  . Knee surgery Left 2002    arthroscopic  . Eye surgery     Social History:  reports that he has never smoked. He has never used smokeless tobacco. He reports that he drinks about 3.6 - 4.8 ounces of alcohol per  week. He reports that he does not use illicit drugs.  Allergies  Allergen Reactions  . Penicillins Anaphylaxis    Family History  Problem Relation Age of Onset  . Heart disease Mother     CAD, PVD  . Hypertension Mother   . Colon polyps Father   . Cancer Maternal Grandfather     lung     Prior to Admission medications   Medication Sig Start Date End Date Taking? Authorizing Provider  allopurinol (ZYLOPRIM) 100 MG tablet Take 100 mg by mouth 2 (two) times daily.   Yes Historical Provider, MD  aspirin 81 MG tablet Take 81 mg by mouth daily.   Yes Historical Provider, MD  Iron Combinations (IRON COMPLEX PO) Take 1 tablet by mouth daily as needed. Take when iron is low   Yes Historical Provider, MD  loratadine (CLARITIN) 10 MG tablet Take 10 mg by mouth daily.   Yes Historical Provider, MD  mometasone (NASONEX) 50 MCG/ACT nasal spray Place 2 sprays into the nose daily.   Yes Historical Provider, MD  Multiple Vitamin (MULTIVITAMIN) tablet Take 1 tablet by mouth daily.   Yes Historical Provider, MD  Olmesartan-Amlodipine-HCTZ (TRIBENZOR) 40-10-25 MG TABS Take 1 tablet by mouth daily. 1/2 tablet daily.   Yes  Historical Provider, MD  omeprazole (PRILOSEC OTC) 20 MG tablet Take 20 mg by mouth daily.   Yes Historical Provider, MD  simvastatin (ZOCOR) 20 MG tablet Take 20 mg by mouth at bedtime.   Yes Historical Provider, MD   Physical Exam: Filed Vitals:   07/31/14 2245  BP: 151/75  Pulse:   Temp:   Resp: 13    BP 151/75  Pulse 111  Temp(Src) 98 F (36.7 C) (Oral)  Resp 13  Ht 5\' 6"  (1.676 m)  Wt 84.823 kg (187 lb)  BMI 30.20 kg/m2  SpO2 78%  General Appearance:    Alert, oriented, no distress, appears stated age  Head:    Normocephalic, atraumatic  Eyes:    PERRL, EOMI, sclera non-icteric        Nose:   Nares without drainage or epistaxis. Mucosa, turbinates normal  Throat:   Moist mucous membranes. Oropharynx without erythema or exudate.  Neck:   Supple. No carotid  bruits.  No thyromegaly.  No lymphadenopathy.   Back:     No CVA tenderness, no spinal tenderness  Lungs:     Clear to auscultation bilaterally, without wheezes, rhonchi or rales  Chest wall:    No tenderness to palpitation  Heart:    Regular rate and rhythm without murmurs, gallops, rubs  Abdomen:     Soft, non-tender, nondistended, normal bowel sounds, no organomegaly  Genitalia:    deferred  Rectal:    deferred  Extremities:   No clubbing, cyanosis or edema.  Pulses:   2+ and symmetric all extremities  Skin:   Skin color, texture, turgor normal, no rashes or lesions  Lymph nodes:   Cervical, supraclavicular, and axillary nodes normal  Neurologic:   CNII-XII intact. Normal strength, sensation and reflexes      throughout    Labs on Admission:  Basic Metabolic Panel:  Recent Labs Lab 07/29/14 1938 07/31/14 2030  NA 130* 126*  K 4.1 3.7  CL 97 85*  CO2  --  19  GLUCOSE 92 114*  BUN 19 11  CREATININE 1.70* 0.83  CALCIUM  --  9.1   Liver Function Tests:  Recent Labs Lab 07/31/14 2030  AST 169*  ALT 127*  ALKPHOS 64  BILITOT 0.5  PROT 7.5  ALBUMIN 4.2   No results found for this basename: LIPASE, AMYLASE,  in the last 168 hours No results found for this basename: AMMONIA,  in the last 168 hours CBC:  Recent Labs Lab 07/29/14 1938 07/31/14 2030  WBC  --  5.5  NEUTROABS  --  3.8  HGB 11.2* 8.2*  HCT 33.0* 27.3*  MCV  --  66.4*  PLT  --  234   Cardiac Enzymes: No results found for this basename: CKTOTAL, CKMB, CKMBINDEX, TROPONINI,  in the last 168 hours  BNP (last 3 results) No results found for this basename: PROBNP,  in the last 8760 hours CBG: No results found for this basename: GLUCAP,  in the last 168 hours  Radiological Exams on Admission: No results found.  EKG: Independently reviewed.  Assessment/Plan Active Problems:   Hyponatremia   Anemia   ETOH abuse   HTN (hypertension)   1. Hyponatremia - Due to liver disease vs water  intoxication vs HCTZ.  Will hold HCTZ, give normal saline, recheck labs in AM. 2. Anemia - suspect iron deficiency given microcytic and hypochromic component, likely from chronic GIB from internal hemorrhoid.  No evidence of acute GIB, hemoccult negative, will use SCDs  for DVT ppx and avoid heparin given suspicion of chronic GIB and also hold his baby ASA.  Iron studies pending.  Folate and B12 deficiency is considered given his EtOH history; however, these would more likely cause a macrocytic anemia, regardless we are replacing these anyhow as per our withdrawal protocol. 3. EtOH abuse - high risk for withdrawal, patient on CIWA. 4. HTN - continue home meds other than HCTZ    Code Status: Full  Family Communication: Wife at bedside Disposition Plan: Admit to inpatient   Time spent: 70 min  Colvin Blatt M. [facility] Pager [phone]  If 7AM-7PM, please contact the day team taking care of the patient [email or web address] Password [facility] 07/31/2014, 10:47 PM

## 2014-08-01 ENCOUNTER — Other Ambulatory Visit: Payer: Self-pay | Admitting: Physician Assistant

## 2014-08-01 DIAGNOSIS — F10239 Alcohol dependence with withdrawal, unspecified: Secondary | ICD-10-CM | POA: Diagnosis present

## 2014-08-01 DIAGNOSIS — F10939 Alcohol use, unspecified with withdrawal, unspecified: Secondary | ICD-10-CM

## 2014-08-01 DIAGNOSIS — E119 Type 2 diabetes mellitus without complications: Secondary | ICD-10-CM

## 2014-08-01 DIAGNOSIS — K219 Gastro-esophageal reflux disease without esophagitis: Secondary | ICD-10-CM | POA: Diagnosis present

## 2014-08-01 DIAGNOSIS — E785 Hyperlipidemia, unspecified: Secondary | ICD-10-CM | POA: Diagnosis present

## 2014-08-01 DIAGNOSIS — G4733 Obstructive sleep apnea (adult) (pediatric): Secondary | ICD-10-CM | POA: Diagnosis present

## 2014-08-01 DIAGNOSIS — K649 Unspecified hemorrhoids: Secondary | ICD-10-CM | POA: Diagnosis present

## 2014-08-01 DIAGNOSIS — I10 Essential (primary) hypertension: Secondary | ICD-10-CM | POA: Diagnosis present

## 2014-08-01 LAB — COMPREHENSIVE METABOLIC PANEL
ALK PHOS: 57 U/L (ref 39–117)
ALT: 117 U/L — AB (ref 0–53)
AST: 156 U/L — AB (ref 0–37)
Albumin: 3.7 g/dL (ref 3.5–5.2)
Anion gap: 19 — ABNORMAL HIGH (ref 5–15)
BILIRUBIN TOTAL: 0.5 mg/dL (ref 0.3–1.2)
BUN: 9 mg/dL (ref 6–23)
CO2: 22 mEq/L (ref 19–32)
Calcium: 9.2 mg/dL (ref 8.4–10.5)
Chloride: 92 mEq/L — ABNORMAL LOW (ref 96–112)
Creatinine, Ser: 0.85 mg/dL (ref 0.50–1.35)
GFR calc Af Amer: >90 mL/min (ref >90)
GFR calc non Af Amer: >90 mL/min (ref >90)
Glucose, Bld: 74 mg/dL (ref 70–99)
POTASSIUM: 4.1 meq/L (ref 3.7–5.3)
Sodium: 133 mEq/L — ABNORMAL LOW (ref 137–147)
TOTAL PROTEIN: 6.6 g/dL (ref 6.0–8.3)

## 2014-08-01 LAB — IRON AND TIBC
IRON: 11 ug/dL — AB (ref 42–135)
Saturation Ratios: 3 % — ABNORMAL LOW (ref 20–55)
TIBC: 390 ug/dL (ref 215–435)
UIBC: 379 ug/dL (ref 125–400)

## 2014-08-01 LAB — CBC
HEMATOCRIT: 25.9 % — AB (ref 39.0–52.0)
Hemoglobin: 7.7 g/dL — ABNORMAL LOW (ref 13.0–17.0)
MCH: 19.8 pg — ABNORMAL LOW (ref 26.0–34.0)
MCHC: 29.7 g/dL — AB (ref 30.0–36.0)
MCV: 66.8 fL — ABNORMAL LOW (ref 78.0–100.0)
PLATELETS: 243 10*3/uL (ref 150–400)
RBC: 3.88 MIL/uL — ABNORMAL LOW (ref 4.22–5.81)
RDW: 17.4 % — ABNORMAL HIGH (ref 11.5–15.5)
WBC: 4.6 10*3/uL (ref 4.0–10.5)

## 2014-08-01 LAB — OSMOLALITY: Osmolality: 275 mOsm/kg (ref 275–300)

## 2014-08-01 LAB — SODIUM, URINE, RANDOM: Sodium, Ur: 71 mEq/L

## 2014-08-01 LAB — OSMOLALITY, URINE: Osmolality, Ur: 364 mOsm/kg — ABNORMAL LOW (ref 390–1090)

## 2014-08-01 LAB — ABO/RH: ABO/RH(D): A POS

## 2014-08-01 LAB — PREPARE RBC (CROSSMATCH)

## 2014-08-01 LAB — FERRITIN: Ferritin: 42 ng/mL (ref 22–322)

## 2014-08-01 LAB — OCCULT BLOOD X 1 CARD TO LAB, STOOL: Fecal Occult Bld: NEGATIVE

## 2014-08-01 MED ORDER — FLUTICASONE PROPIONATE 50 MCG/ACT NA SUSP
2.0000 | Freq: Every day | NASAL | Status: DC
  Administered 2014-08-01 – 2014-08-03 (×4): 2 via NASAL
  Filled 2014-08-01: qty 16

## 2014-08-01 MED ORDER — LORAZEPAM 2 MG/ML IJ SOLN
0.0000 mg | Freq: Four times a day (QID) | INTRAMUSCULAR | Status: AC
  Administered 2014-08-01: 1 mg via INTRAVENOUS
  Administered 2014-08-01: 2 mg via INTRAVENOUS
  Filled 2014-08-01 (×2): qty 1

## 2014-08-01 MED ORDER — LORAZEPAM 2 MG/ML IJ SOLN: 0.0000 mg | Freq: Two times a day (BID) | INTRAMUSCULAR | Status: DC

## 2014-08-01 MED ORDER — SODIUM CHLORIDE 0.9 % IV SOLN
Freq: Once | INTRAVENOUS | Status: AC
  Administered 2014-08-01: 17:00:00 via INTRAVENOUS

## 2014-08-01 MED ORDER — ONDANSETRON HCL 4 MG/2ML IJ SOLN
4.0000 mg | Freq: Four times a day (QID) | INTRAMUSCULAR | Status: DC | PRN
  Administered 2014-08-01: 4 mg via INTRAVENOUS
  Filled 2014-08-01: qty 2

## 2014-08-01 MED ORDER — OMEPRAZOLE 20 MG PO CPDR
20.0000 mg | DELAYED_RELEASE_CAPSULE | Freq: Every day | ORAL | Status: DC
  Administered 2014-08-01 – 2014-08-03 (×4): 20 mg via ORAL
  Filled 2014-08-01 (×6): qty 1

## 2014-08-01 MED ORDER — OMEPRAZOLE MAGNESIUM 20 MG PO TBEC
20.0000 mg | DELAYED_RELEASE_TABLET | Freq: Every day | ORAL | Status: DC
  Filled 2014-08-01: qty 1

## 2014-08-01 NOTE — Consult Note (Signed)
[facility] Consult Note  Referring Provider: No ref. provider found Primary Care Physician:  Mayra Neer, MD Primary Gastroenterologist:  Dr.  Laurel Dimmer Complaint: Weakness, fatigue, found to be anemic HPI: Raymond Hutchinson is an 53 y.o. white male  who presents with the above symptoms for the last 2 weeks as well as some mucousy vomiting and hiccups. He was found to have a hemoglobin of 7.7. He denies any hematemesis or melena. He heme-negative stool in the emergency room. He does have rectal bleeding from hemorrhoids. He had a colonoscopy in December 2014 which did not show any polyps he was recommended for 5 year followup. His only EGD was over a decade ago for esophageal stricture.  The patient has been recently worked up for elevated liver function tests at either due to fatty liver or alcohol. The patient does admit to drinking 3-7 per been drinks per night. He does not use nonsteroidal anti-inflammatory drugs. Apparently an i-STAT hemoglobin was 11.2 2 days ago  Past Medical History  Diagnosis Date  . Hypertension     Benign  . Hyperlipidemia   . GERD (gastroesophageal reflux disease)   . Allergy   . Obstructive sleep apnea   . Diabetes mellitus without complication 1017    AODM  . Anemia     iron deficiency  . Alcohol abuse   . Hemorrhoids     Past Surgical History  Procedure Laterality Date  . Septoplasty  2002  . Knee surgery Left 2002    arthroscopic  . Eye surgery      Medications Prior to Admission  Medication Sig Dispense Refill  . allopurinol (ZYLOPRIM) 100 MG tablet Take 100 mg by mouth 2 (two) times daily.      Marland Kitchen aspirin 81 MG tablet Take 81 mg by mouth daily.      . Iron Combinations (IRON COMPLEX PO) Take 1 tablet by mouth daily as needed. Take when iron is low      . loratadine (CLARITIN) 10 MG tablet Take 10 mg by mouth daily.      . mometasone (NASONEX) 50 MCG/ACT nasal spray Place 2 sprays into the nose daily.      . Multiple Vitamin  (MULTIVITAMIN) tablet Take 1 tablet by mouth daily.      . Olmesartan-Amlodipine-HCTZ (TRIBENZOR) 40-10-25 MG TABS Take 1 tablet by mouth daily. 1/2 tablet daily.      Marland Kitchen omeprazole (PRILOSEC OTC) 20 MG tablet Take 20 mg by mouth daily.      . simvastatin (ZOCOR) 20 MG tablet Take 20 mg by mouth at bedtime.        Allergies:  Allergies  Allergen Reactions  . Penicillins Anaphylaxis    Family History  Problem Relation Age of Onset  . Heart disease Mother     CAD, PVD  . Hypertension Mother   . Colon polyps Father   . Cancer Maternal Grandfather     lung    Social History:  reports that he has never smoked. He has never used smokeless tobacco. He reports that he drinks about 3.6 - 4.8 ounces of alcohol per week. He reports that he does not use illicit drugs.  Review of Systems: negative except tremulousness   Blood pressure 138/63, pulse 125, temperature 98.1 F (36.7 C), temperature source Oral, resp. rate 18, height $RemoveBe'5\' 6"'kigrxCGgL$  (1.676 m), weight 83.416 kg (183 lb 14.4 oz), SpO2 96.00%. Head: Normocephalic, without obvious abnormality, atraumatic Neck: no adenopathy, no carotid bruit, no JVD, supple, symmetrical, trachea midline  and thyroid not enlarged, symmetric, no tenderness/mass/nodules Resp: clear to auscultation bilaterally Cardio: regular rate and rhythm, S1, S2 normal, no murmur, click, rub or gallop GI: Abdomen soft distended with normoactive bowel sounds. No hepatomegaly masses or guarding Extremities: extremities normal, atraumatic, no cyanosis or edema  Results for orders placed during the hospital encounter of 07/31/14 (from the past 48 hour(s))  CBC WITH DIFFERENTIAL     Status: Abnormal   Collection Time    07/31/14  8:30 PM      Result Value Ref Range   WBC 5.5  4.0 - 10.5 K/uL   RBC 4.11 (*) 4.22 - 5.81 MIL/uL   Hemoglobin 8.2 (*) 13.0 - 17.0 g/dL   HCT 27.3 (*) 39.0 - 52.0 %   MCV 66.4 (*) 78.0 - 100.0 fL   MCH 20.0 (*) 26.0 - 34.0 pg   MCHC 30.0  30.0 - 36.0  g/dL   RDW 17.1 (*) 11.5 - 15.5 %   Platelets 234  150 - 400 K/uL   Neutrophils Relative % 70  43 - 77 %   Lymphocytes Relative 18  12 - 46 %   Monocytes Relative 11  3 - 12 %   Eosinophils Relative 1  0 - 5 %   Basophils Relative 0  0 - 1 %   Neutro Abs 3.8  1.7 - 7.7 K/uL   Lymphs Abs 1.0  0.7 - 4.0 K/uL   Monocytes Absolute 0.6  0.1 - 1.0 K/uL   Eosinophils Absolute 0.1  0.0 - 0.7 K/uL   Basophils Absolute 0.0  0.0 - 0.1 K/uL   RBC Morphology POLYCHROMASIA PRESENT     Comment: TARGET CELLS     ELLIPTOCYTES  COMPREHENSIVE METABOLIC PANEL     Status: Abnormal   Collection Time    07/31/14  8:30 PM      Result Value Ref Range   Sodium 126 (*) 137 - 147 mEq/L   Potassium 3.7  3.7 - 5.3 mEq/L   Chloride 85 (*) 96 - 112 mEq/L   CO2 19  19 - 32 mEq/L   Glucose, Bld 114 (*) 70 - 99 mg/dL   BUN 11  6 - 23 mg/dL   Creatinine, Ser 0.83  0.50 - 1.35 mg/dL   Calcium 9.1  8.4 - 10.5 mg/dL   Total Protein 7.5  6.0 - 8.3 g/dL   Albumin 4.2  3.5 - 5.2 g/dL   AST 169 (*) 0 - 37 U/L   ALT 127 (*) 0 - 53 U/L   Alkaline Phosphatase 64  39 - 117 U/L   Total Bilirubin 0.5  0.3 - 1.2 mg/dL   GFR calc non Af Amer >90  >90 mL/min   GFR calc Af Amer >90  >90 mL/min   Comment: (NOTE)     The eGFR has been calculated using the CKD EPI equation.     This calculation has not been validated in all clinical situations.     eGFR's persistently <90 mL/min signify possible Chronic Kidney     Disease.   Anion gap 22 (*) 5 - 15   Comment: RESULT CHECKED  IRON AND TIBC     Status: Abnormal   Collection Time    07/31/14 10:45 PM      Result Value Ref Range   Iron 11 (*) 42 - 135 ug/dL   TIBC 390  215 - 435 ug/dL   Saturation Ratios 3 (*) 20 - 55 %   UIBC 379  125 - 400 ug/dL   Comment: Performed at [facility]     Status: None   Collection Time    07/31/14 10:45 PM      Result Value Ref Range   Ferritin 42  22 - 322 ng/mL   Comment: Performed at [facility]  CBC      Status: Abnormal   Collection Time    08/01/14  6:10 AM      Result Value Ref Range   WBC 4.6  4.0 - 10.5 K/uL   RBC 3.88 (*) 4.22 - 5.81 MIL/uL   Hemoglobin 7.7 (*) 13.0 - 17.0 g/dL   HCT 25.9 (*) 39.0 - 52.0 %   MCV 66.8 (*) 78.0 - 100.0 fL   MCH 19.8 (*) 26.0 - 34.0 pg   MCHC 29.7 (*) 30.0 - 36.0 g/dL   RDW 17.4 (*) 11.5 - 15.5 %   Platelets 243  150 - 400 K/uL  COMPREHENSIVE METABOLIC PANEL     Status: Abnormal   Collection Time    08/01/14  6:10 AM      Result Value Ref Range   Sodium 133 (*) 137 - 147 mEq/L   Potassium 4.1  3.7 - 5.3 mEq/L   Chloride 92 (*) 96 - 112 mEq/L   CO2 22  19 - 32 mEq/L   Glucose, Bld 74  70 - 99 mg/dL   BUN 9  6 - 23 mg/dL   Creatinine, Ser 0.85  0.50 - 1.35 mg/dL   Calcium 9.2  8.4 - 10.5 mg/dL   Total Protein 6.6  6.0 - 8.3 g/dL   Albumin 3.7  3.5 - 5.2 g/dL   AST 156 (*) 0 - 37 U/L   ALT 117 (*) 0 - 53 U/L   Alkaline Phosphatase 57  39 - 117 U/L   Total Bilirubin 0.5  0.3 - 1.2 mg/dL   GFR calc non Af Amer >90  >90 mL/min   GFR calc Af Amer >90  >90 mL/min   Comment: (NOTE)     The eGFR has been calculated using the CKD EPI equation.     This calculation has not been validated in all clinical situations.     eGFR's persistently <90 mL/min signify possible Chronic Kidney     Disease.   Anion gap 19 (*) 5 - 15   No results found.  Assessment: 1. Anemia with so far heme-negative stools and negative colonoscopy December 2014 2. Nonbloody emesis 3. Hiccups 4. Hyponatremia 5. Elevated liver function tests in all likelihood related to alcohol Plan:  1. Small amount of rectal bleeding almost undoubtedly due to hemorrhoids. Doubt accounts for majority of his anemia. 2. We'll probably need EGD at some point to evaluate for upper GI sources of bleed, screen for varices, and evaluate nausea and vomiting. 3. We'll review office records for workup of his elevated liver function tests. 4. DT prophylaxis for which he would appear to be a fairly  high-risk. RCVEL,FYBO C 08/01/2014, 10:37 AM

## 2014-08-01 NOTE — Progress Notes (Signed)
Pt has home cpap, places himself on and off. Sterile water added and pt aware he can call for any assistance at any time. RT will continue to monitor.

## 2014-08-01 NOTE — Progress Notes (Signed)
Patient ID: Raymond Hutchinson  male  ZOX:[id number]    DOB: 02-04-1961    DOA: 07/31/2014  PCP: Lupita RaiderSHAW,KIMBERLEE, MD  Assessment/Plan: Principal Problem:   Anemia with history of hemorrhoids, GERD/PUD -Unclear etiology, hemoglobin 11.2 on 8/1, and 7.7 this morning, worsened also from hemodilution. - Patient denies any gross rectal bleeding or dark tarry stools, he did have bleeding 3 weeks ago  - Per patient, had colonoscopy last year, EGD 10 years ago had a sufficient stricture, follows Dr. Dulce Sellarutlaw, GI consult called  - Will transfuse 2 units of packed RBCs   Active Problems:   Hyponatremia: improving, poor appetite in the last 2 weeks with intermittent nausea and vomiting, alcoholic gastritis - Continue IV fluid hydration, obtain a serum osmolarity, urine was bloody, urine sodium - Sodium is improving     ETOH abuse With alcohol withdrawals: Patient admits to drinking heavily, 6-8 merlot & bourbon every day. Last drink last night - Continue on IV fluids, CIWA protocol with ativan, thiamine, folate - Will change to CIWA with scheduled ativan, patient tremulous and tachycardic    HTN (hypertension) - Currently stable    Hyperlipidemia- hold statins due to transaminitis  Transaminitis: Likely from alcohol, hold statins, recheck LFTs in a.m. - Will likely need right upper quadrant ultrasound for further workup if not already done in [facility], will await gastroenterology recommendations    GERD (gastroesophageal reflux disease) - Continue PPI    Obstructive sleep apnea - CPAP at night  DVT Prophylaxis:  Code Status:Full code   Family Communication:  Disposition:  Consultants:  Gastroenterology   Procedures:  None    Antibiotics:None    Subjective: Patient seen and examined, having intermittent emesis, hiccups, tremulous   Objective: Weight change:   Intake/Output Summary (Last 24 hours) at 08/01/14 1130 Last data filed at 08/01/14 0545  Gross per 24  hour  Intake 2129.17 ml  Output   1525 ml  Net 604.17 ml   Blood pressure 138/63, pulse 125, temperature 98.1 F (36.7 C), temperature source Oral, resp. rate 18, height 5\' 6"  (1.676 m), weight 83.416 kg (183 lb 14.4 oz), SpO2 96.00%.  Physical Exam: General: Alert and awake, oriented x3, anxious, tremulous CVS: S1-S2 clear,  tachycardic  Chest: clear to auscultation bilaterally, no wheezing, rales or rhonchi Abdomen: soft  nondistended, normal bowel sounds  Extremities: no cyanosis, clubbing or edema noted bilaterally Neuro: Cranial nerves II-XII intact, no focal neurological deficits  Lab Results: Basic Metabolic Panel:  Recent Labs Lab 07/31/14 2030 08/01/14 0610  NA 126* 133*  K 3.7 4.1  CL 85* 92*  CO2 19 22  GLUCOSE 114* 74  BUN 11 9  CREATININE 0.83 0.85  CALCIUM 9.1 9.2   Liver Function Tests:  Recent Labs Lab 07/31/14 2030 08/01/14 0610  AST 169* 156*  ALT 127* 117*  ALKPHOS 64 57  BILITOT 0.5 0.5  PROT 7.5 6.6  ALBUMIN 4.2 3.7   No results found for this basename: LIPASE, AMYLASE,  in the last 168 hours No results found for this basename: AMMONIA,  in the last 168 hours CBC:  Recent Labs Lab 07/31/14 2030 08/01/14 0610  WBC 5.5 4.6  NEUTROABS 3.8  --   HGB 8.2* 7.7*  HCT 27.3* 25.9*  MCV 66.4* 66.8*  PLT 234 243   Cardiac Enzymes: No results found for this basename: CKTOTAL, CKMB, CKMBINDEX, TROPONINI,  in the last 168 hours BNP: No components found with this basename: POCBNP,  CBG: No results  found for this basename: GLUCAP,  in the last 168 hours   Micro Results: No results found for this or any previous visit (from the past 240 hour(s)).  Studies/Results: No results found.  Medications: Scheduled Meds: . sodium chloride   Intravenous Once  . allopurinol  100 mg Oral BID  . amLODipine  5 mg Oral Daily  . fluticasone  2 spray Each Nare QHS  . folic acid  1 mg Oral Daily  . irbesartan  150 mg Oral Daily  . loratadine  10 mg  Oral Daily  . multivitamin with minerals  1 tablet Oral Daily  . omeprazole  20 mg Oral QHS  . simvastatin  20 mg Oral QHS  . sodium chloride  3 mL Intravenous Q12H  . thiamine  100 mg Oral Daily   Or  . thiamine  100 mg Intravenous Daily      LOS: 1 day   Joniece Smotherman M.D. [facility] 08/01/2014, 11:30 AM Pager: [phone]  If 7PM-7AM, please contact night-coverage [email or web address] Password [email or web address]  **Disclaimer: This note was dictated with voice recognition software. Similar sounding words can inadvertently be transcribed and this note may contain transcription errors which may not have been corrected upon publication of note.**

## 2014-08-02 LAB — TYPE AND SCREEN
ABO/RH(D): A POS
Antibody Screen: NEGATIVE
Unit division: 0
Unit division: 0

## 2014-08-02 LAB — BASIC METABOLIC PANEL
ANION GAP: 16 — AB (ref 5–15)
BUN: 8 mg/dL (ref 6–23)
CHLORIDE: 96 meq/L (ref 96–112)
CO2: 23 meq/L (ref 19–32)
CREATININE: 0.87 mg/dL (ref 0.50–1.35)
Calcium: 9.2 mg/dL (ref 8.4–10.5)
GFR calc Af Amer: >90 mL/min (ref >90)
GFR calc non Af Amer: >90 mL/min (ref >90)
Glucose, Bld: 85 mg/dL (ref 70–99)
POTASSIUM: 4.5 meq/L (ref 3.7–5.3)
Sodium: 135 mEq/L — ABNORMAL LOW (ref 137–147)

## 2014-08-02 LAB — CBC
HCT: 31.7 % — ABNORMAL LOW (ref 39.0–52.0)
HEMOGLOBIN: 9.8 g/dL — AB (ref 13.0–17.0)
MCH: 22 pg — ABNORMAL LOW (ref 26.0–34.0)
MCHC: 30.9 g/dL (ref 30.0–36.0)
MCV: 71.2 fL — ABNORMAL LOW (ref 78.0–100.0)
Platelets: 201 10*3/uL (ref 150–400)
RBC: 4.45 MIL/uL (ref 4.22–5.81)
RDW: 19.1 % — ABNORMAL HIGH (ref 11.5–15.5)
WBC: 6.8 10*3/uL (ref 4.0–10.5)

## 2014-08-02 MED ORDER — OXYMETAZOLINE HCL 0.05 % NA SOLN
2.0000 | Freq: Once | NASAL | Status: DC
  Filled 2014-08-02: qty 15

## 2014-08-02 MED ORDER — LIDOCAINE-PRILOCAINE 2.5-2.5 % EX CREA
1.0000 | TOPICAL_CREAM | Freq: Once | CUTANEOUS | Status: DC
Start: 2014-08-03 — End: 2014-08-03
  Filled 2014-08-02: qty 5

## 2014-08-02 NOTE — Care Management Note (Signed)
    Page 1 of 1   08/02/2014     9:50:22 AM CARE MANAGEMENT NOTE 08/02/2014  Patient:  Raymond Hutchinson,Raymond Hutchinson   Account Number:  [id number]  Date Initiated:  08/02/2014  Documentation initiated by:  GRAVES-BIGELOW,Ambria Mayfield  Subjective/Objective Assessment:   Pt admitted for Generalized weakness. hyponatremia and anemia. CIWA protocol. CSW to provide resources.     Action/Plan:   No needs from CM at this time.   Anticipated DC Date:  08/03/2014   Anticipated DC Plan:  HOME/SELF CARE      DC Planning Services  CM consult      Choice offered to / List presented to:             Status of service:  Completed, signed off Medicare Important Message given?  NO (If response is "NO", the following Medicare IM given date fields will be blank) Date Medicare IM given:   Medicare IM given by:   Date Additional Medicare IM given:   Additional Medicare IM given by:    Discharge Disposition:  HOME/SELF CARE  Per UR Regulation:  Reviewed for med. necessity/level of care/duration of stay  If discussed at Long Length of Stay Meetings, dates discussed:    Comments:

## 2014-08-02 NOTE — Progress Notes (Signed)
[facility] Progress Note  Subjective: Patient feels okay, still having hiccups, no vomiting, no stools. Received 2 unit blood transfusion last night  Objective: Vital signs in last 24 hours: Temp:  [97.9 F (36.6 C)-98.5 F (36.9 C)] 98.1 F (36.7 C) (08/04 2358) Pulse Rate:  [97-117] 102 (08/04 2358) Resp:  [18-20] 18 (08/04 2358) BP: (123-150)/(51-82) 133/82 mmHg (08/04 2358) SpO2:  [95 %-100 %] 98 % (08/04 2358) Weight change:    PE: Unchanged not particularly tremulous  Lab Results: Results for orders placed during the hospital encounter of 07/31/14 (from the past 24 hour(s))  TYPE AND SCREEN     Status: None   Collection Time    08/01/14 11:34 AM      Result Value Ref Range   ABO/RH(D) A POS     Antibody Screen NEG     Sample Expiration 08/04/2014     Unit Number [id number]     Blood Component Type RED CELLS,LR     Unit division 00     Status of Unit ISSUED     Transfusion Status OK TO TRANSFUSE     Crossmatch Result Compatible     Unit Number [id number]     Blood Component Type RED CELLS,LR     Unit division 00     Status of Unit ISSUED     Transfusion Status OK TO TRANSFUSE     Crossmatch Result Compatible    PREPARE RBC (CROSSMATCH)     Status: None   Collection Time    08/01/14 11:34 AM      Result Value Ref Range   Order Confirmation ORDER PROCESSED BY BLOOD BANK    ABO/RH     Status: None   Collection Time    08/01/14 11:34 AM      Result Value Ref Range   ABO/RH(D) A POS    OSMOLALITY, URINE     Status: Abnormal   Collection Time    08/01/14 12:33 PM      Result Value Ref Range   Osmolality, Ur 364 (*) 390 - 1090 mOsm/kg  SODIUM, URINE, RANDOM     Status: None   Collection Time    08/01/14 12:33 PM      Result Value Ref Range   Sodium, Ur 71    OCCULT BLOOD X 1 CARD TO LAB, STOOL     Status: None   Collection Time    08/01/14 12:33 PM      Result Value Ref Range   Fecal Occult Bld NEGATIVE  NEGATIVE  BASIC METABOLIC PANEL      Status: Abnormal   Collection Time    08/02/14  3:40 AM      Result Value Ref Range   Sodium 135 (*) 137 - 147 mEq/L   Potassium 4.5  3.7 - 5.3 mEq/L   Chloride 96  96 - 112 mEq/L   CO2 23  19 - 32 mEq/L   Glucose, Bld 85  70 - 99 mg/dL   BUN 8  6 - 23 mg/dL   Creatinine, Ser 6.290.87  0.50 - 1.35 mg/dL   Calcium 9.2  8.4 - 52.810.5 mg/dL   GFR calc non Af Amer >90  >90 mL/min   GFR calc Af Amer >90  >90 mL/min   Anion gap 16 (*) 5 - 15  CBC     Status: Abnormal   Collection Time    08/02/14  3:40 AM      Result Value Ref Range  WBC 6.8  4.0 - 10.5 K/uL   RBC 4.45  4.22 - 5.81 MIL/uL   Hemoglobin 9.8 (*) 13.0 - 17.0 g/dL   HCT 91.4 (*) 78.2 - 95.6 %   MCV 71.2 (*) 78.0 - 100.0 fL   MCH 22.0 (*) 26.0 - 34.0 pg   MCHC 30.9  30.0 - 36.0 g/dL   RDW 21.3 (*) 08.6 - 57.8 %   Platelets 201  150 - 400 K/uL    Studies/Results: No results found.    Assessment: 1. Anemia 2. Nausea vomiting and hiccups 3. Alcoholic liver disease  Plan: Continue medical workup. EGD tomorrow morning   Raymond Hutchinson C 08/02/2014, 9:26 AM

## 2014-08-02 NOTE — Progress Notes (Signed)
[facility] PROGRESS NOTE  Raymond Hutchinson ZOX:[id number] DOB: [birth year] DOA: 07/31/2014 PCP: Lupita Raider, MD  Assessment/Plan: 53 y.o. Male with alcoholism who presents to the ED with c/o generalized weakness and fatigue for the past 2 weeks. He saw Dr. Valentina Lucks in the office and was found to have hyponatremia and anemia on labs before being sent in to the ED.   1. 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 Dulce Sellar - Hg improved after 2 units of packed RBCs 8/4; Monitor Hg TF prn;  - per GI EGD in AM 8/6  2. Hyponatremia: improving, poor appetite in the last 2 weeks with intermittent nausea and vomiting -improved on IVF;  3. ETOH abuse With alcohol withdrawals: Patient admits to drinking heavily, 6-8 merlot & bourbon every day.  - Mild symptoms of withdrawal; no DTs; no hallucinations;  - Continue on IV fluids, CIWA protocol with ativan, thiamine, folate 4. HTN (hypertension) Currently stable  5. Hyperlipidemia- hold statins due to transaminitis  6. Transaminitis: Likely from alcohol, hold statins  - Will likely need right upper quadrant ultrasound for further workup if not already done in [place] [facility] office, will await gastroenterology recommendations  GERD (gastroesophageal reflux disease)  7. Obstructive sleep apnea  CPAP at night   Code Status: full Family Communication:  D/w patient (indicate person spoken with, relationship, and if by phone, the number) Disposition Plan: home 24-48 hrs    Consultants:  GI  Procedures:  none  Antibiotics:  None  (indicate start date, and stop date if known)  HPI/Subjective: alert  Objective: Filed Vitals:   08/01/14 2358  BP: 133/82  Pulse: 102  Temp: 98.1 F (36.7 C)  Resp: 18    Intake/Output Summary (Last 24 hours) at 08/02/14 0957 Last data filed at 08/02/14 0600  Gross  per 24 hour  Intake 4068.34 ml  Output   2350 ml  Net 1718.34 ml   Filed Weights   07/31/14 2023 07/31/14 2345 08/01/14 0544  Weight: 84.823 kg (187 lb) 83.416 kg (183 lb 14.4 oz) 83.416 kg (183 lb 14.4 oz)    Exam:   General:  alert  Cardiovascular: s1,s2 rrr  Respiratory: CTA BL  Abdomen: soft, nt,nd   Musculoskeletal: no LE edema   Data Reviewed: Basic Metabolic Panel:  Recent Labs Lab 07/29/14 1938 07/31/14 2030 08/01/14 0610 08/02/14 0340  NA 130* 126* 133* 135*  K 4.1 3.7 4.1 4.5  CL 97 85* 92* 96  CO2  --  19 22 23   GLUCOSE 92 114* 74 85  BUN 19 11 9 8   CREATININE 1.70* 0.83 0.85 0.87  CALCIUM  --  9.1 9.2 9.2   Liver Function Tests:  Recent Labs Lab 07/31/14 2030 08/01/14 0610  AST 169* 156*  ALT 127* 117*  ALKPHOS 64 57  BILITOT 0.5 0.5  PROT 7.5 6.6  ALBUMIN 4.2 3.7   No results found for this basename: LIPASE, AMYLASE,  in the last 168 hours No results found for this basename: AMMONIA,  in the last 168 hours CBC:  Recent Labs Lab 07/29/14 1938 07/31/14 2030 08/01/14 0610 08/02/14 0340  WBC  --  5.5 4.6 6.8  NEUTROABS  --  3.8  --   --   HGB 11.2* 8.2* 7.7* 9.8*  HCT 33.0* 27.3* 25.9* 31.7*  MCV  --  66.4* 66.8* 71.2*  PLT  --  234 243 201   Cardiac Enzymes: No results found for this basename: CKTOTAL, CKMB, CKMBINDEX, TROPONINI,  in the last 168 hours BNP (last 3 results) No results found for this basename: PROBNP,  in the last 8760 hours CBG: No results found for this basename: GLUCAP,  in the last 168 hours  No results found for this or any previous visit (from the past 240 hour(s)).   Studies: No results found.  Scheduled Meds: . allopurinol  100 mg Oral BID  . amLODipine  5 mg Oral Daily  . fluticasone  2 spray Each Nare QHS  . folic acid  1 mg Oral Daily  . irbesartan  150 mg Oral Daily  . loratadine  10 mg Oral Daily  . LORazepam  0-4 mg Intravenous Q6H   Followed by  . [START ON 08/03/2014] LORazepam  0-4 mg  Intravenous Q12H  . multivitamin with minerals  1 tablet Oral Daily  . omeprazole  20 mg Oral QHS  . sodium chloride  3 mL Intravenous Q12H  . thiamine  100 mg Oral Daily   Or  . thiamine  100 mg Intravenous Daily   Continuous Infusions: . sodium chloride 100 mL/hr at 08/01/14 2348    Principal Problem:   Anemia Active Problems:   Hyponatremia   ETOH abuse   HTN (hypertension)   Alcohol withdrawal   Hypertension   Hyperlipidemia   GERD (gastroesophageal reflux disease)   Obstructive sleep apnea   Diabetes mellitus without complication   Hemorrhoids    Time spent: >35 minutes     Esperanza SheetsBURIEV, Delara Shepheard N  [facility] Pager [phone]. If 7PM-7AM, please contact night-coverage at [email or web address], password [email or web address] 08/02/2014, 9:57 AM  LOS: 2 days

## 2014-08-02 NOTE — Progress Notes (Signed)
This RN has had several conversations throughout the day concerning what to expect in relation to alcohol withdrawals. Spoke honestly to patient and wife. Possible hallucinations, sweating, N&V, increased HR, Increased BP, possible dehydration among other things. Very appreciative for information stated when he asked what to expect he was told "there is medicine for it."  Encouraged to get a support group of some kind for self and encouraged wife to do the same. Gave wife the name of a book to help her see where she can help herself help her husband. Both were very appreciative of the information.

## 2014-08-02 NOTE — Progress Notes (Signed)
Clinical Social Work Department BRIEF PSYCHOSOCIAL ASSESSMENT 08/02/2014  Patient:  Raymond Hutchinson,Raymond Hutchinson     Account Number:  [id number]     Admit date:  07/31/2014  Clinical Social Worker:  Harless NakayamaAMBELAL,Psalm Schappell, LCSWA  Date/Time:  08/02/2014 11:00 AM  Referred by:  Physician  Date Referred:  08/02/2014 Referred for  Substance Abuse   Other Referral:   Interview type:  Patient Other interview type:    PSYCHOSOCIAL DATA Living Status:  WIFE Admitted from facility:   Level of care:   Primary support name:  Vallery Ridgelizabeth Zawadzki [phone] Primary support relationship to patient:  SPOUSE Degree of support available:   Pt has strong family support    CURRENT CONCERNS Current Concerns  Post-Acute Placement   Other Concerns:    SOCIAL WORK ASSESSMENT / PLAN CSW informed that pt has been using alcohol since his teens and his seeking help to stop. CSW visited pt room and spoke with pt who was seated at bedside. Pt expressed he is in a good mood and feels relieved. However, pt also expressed being anxious for the next 24-48 hours when he starts going through withdrawals. CSW offered support. Pt has many questions about what withdrawal will be like. Pt nurse also present and spoke with pt about "typical" withdrawl from alcohol. Pt informed CSW he is a "happy and functional drunk".  Pt has had at least a drink a day for as long as he can remember but admits that recently he has started to drink all day and has anywhere form 6-12 drinks a day. Pt associated the increased use of alcohol with increased stress. CSW spoke with pt about available support. Pt reports his family is very supportive but have never mentioned pt drinking to him, but they were not surprised when pt told them about recent escalation of use of alcohol. CSW had in length discussion with pt and pt nurse. Please see SBIRT for further details. CSW provided pt with resources. At this time, pt has no further hospital social work needs. CSW signing  off.   Assessment/plan status:  Psychosocial Support/Ongoing Assessment of Needs Other assessment/ plan:   Information/referral to community resources:   Substance abuse treatment list    PATIENT'Hutchinson/FAMILY'Hutchinson RESPONSE TO PLAN OF CARE: Pt pleasant and cooperative. Pt understanding and agreeable to making use of resouces.       Ophelia Sipe, LCSWA [phone]

## 2014-08-03 ENCOUNTER — Encounter (HOSPITAL_COMMUNITY)
Admission: EM | Disposition: A | Discharge status: Home / Self Care | Payer: Self-pay | Source: Home / Self Care | Attending: Internal Medicine

## 2014-08-03 ENCOUNTER — Inpatient Hospital Stay (HOSPITAL_COMMUNITY): Payer: BC Managed Care – PPO | Admitting: Anesthesiology

## 2014-08-03 ENCOUNTER — Encounter (HOSPITAL_COMMUNITY): Payer: BC Managed Care – PPO | Admitting: Anesthesiology

## 2014-08-03 ENCOUNTER — Encounter (HOSPITAL_COMMUNITY): Payer: Self-pay

## 2014-08-03 HISTORY — PX: ESOPHAGOGASTRODUODENOSCOPY: SHX5428

## 2014-08-03 LAB — CBC
HEMATOCRIT: 30.9 % — AB (ref 39.0–52.0)
Hemoglobin: 9.4 g/dL — ABNORMAL LOW (ref 13.0–17.0)
MCH: 21.9 pg — AB (ref 26.0–34.0)
MCHC: 30.4 g/dL (ref 30.0–36.0)
MCV: 71.9 fL — AB (ref 78.0–100.0)
Platelets: 195 10*3/uL (ref 150–400)
RBC: 4.3 MIL/uL (ref 4.22–5.81)
RDW: 19.3 % — ABNORMAL HIGH (ref 11.5–15.5)
WBC: 7.1 10*3/uL (ref 4.0–10.5)

## 2014-08-03 LAB — GLUCOSE, CAPILLARY: GLUCOSE-CAPILLARY: 127 mg/dL — AB (ref 70–99)

## 2014-08-03 SURGERY — EGD (ESOPHAGOGASTRODUODENOSCOPY)
Anesthesia: Monitor Anesthesia Care

## 2014-08-03 MED ORDER — LACTATED RINGERS IV SOLN
INTRAVENOUS | Status: DC
  Administered 2014-08-03: 1000 mL via INTRAVENOUS

## 2014-08-03 MED ORDER — FENTANYL CITRATE 0.05 MG/ML IJ SOLN
INTRAMUSCULAR | Status: DC | PRN
  Administered 2014-08-03: 50 ug via INTRAVENOUS

## 2014-08-03 MED ORDER — BUTAMBEN-TETRACAINE-BENZOCAINE 2-2-14 % EX AERO
INHALATION_SPRAY | CUTANEOUS | Status: DC | PRN
  Administered 2014-08-03: 2 via TOPICAL

## 2014-08-03 MED ORDER — FERROUS SULFATE 325 (65 FE) MG PO TABS
325.0000 mg | ORAL_TABLET | Freq: Three times a day (TID) | ORAL | Status: DC
  Administered 2014-08-03 – 2014-08-04 (×2): 325 mg via ORAL
  Filled 2014-08-03 (×7): qty 1

## 2014-08-03 MED ORDER — PROPOFOL INFUSION 10 MG/ML OPTIME
INTRAVENOUS | Status: DC | PRN
  Administered 2014-08-03: 30 ug/kg/min via INTRAVENOUS

## 2014-08-03 MED ORDER — MIDAZOLAM HCL 5 MG/5ML IJ SOLN
INTRAMUSCULAR | Status: DC | PRN
  Administered 2014-08-03: 2 mg via INTRAVENOUS

## 2014-08-03 NOTE — Anesthesia Preprocedure Evaluation (Addendum)
Anesthesia Evaluation  Patient identified by MRN, date of birth, ID band Patient awake    Reviewed: Allergy & Precautions, H&P , NPO status , Patient's Chart, lab work & pertinent test results  Airway Mallampati: II TM Distance: >3 FB Neck ROM: Full    Dental  (+) Teeth Intact   Pulmonary sleep apnea and Continuous Positive Airway Pressure Ventilation ,  breath sounds clear to auscultation        Cardiovascular hypertension, Pt. on medications Rhythm:Regular Rate:Normal     Neuro/Psych    GI/Hepatic GERD-  Medicated and Controlled,(+)     substance abuse  alcohol use,   Endo/Other  diabetes, Type 2Diet controlled  Renal/GU      Musculoskeletal   Abdominal   Peds  Hematology  (+) anemia ,   Anesthesia Other Findings   Reproductive/Obstetrics                        Anesthesia Physical Anesthesia Plan  ASA: III  Anesthesia Plan: MAC   Post-op Pain Management:    Induction: Intravenous  Airway Management Planned: Natural Airway and Nasal Cannula  Additional Equipment:   Intra-op Plan:   Post-operative Plan:   Informed Consent: I have reviewed the patients History and Physical, chart, labs and discussed the procedure including the risks, benefits and alternatives for the proposed anesthesia with the patient or authorized representative who has indicated his/her understanding and acceptance.   Dental advisory given  Plan Discussed with: CRNA and Surgeon  Anesthesia Plan Comments:         Anesthesia Quick Evaluation

## 2014-08-03 NOTE — Progress Notes (Signed)
[facility] PROGRESS NOTE  SUMEDH SHINSATO ZOX:[id number] DOB: 1961-09-17 DOA: 07/31/2014 PCP: Lupita Raider, MD  Assessment/Plan: 53 y.o. Male with alcoholism who presents to the ED with c/o generalized weakness and fatigue for the past 2 weeks. He saw Dr. Valentina Lucks in the office and was found to have hyponatremia and anemia on labs before being sent in to the ED.   1. 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 Dulce Sellar - Hg improved after 2 units of packed RBCs 8/4; Monitor Hg TF prn;  - per GI EGD in AM 8/6  2. Hyponatremia: improving, poor appetite in the last 2 weeks with intermittent nausea and vomiting -improved on IVF;  3. ETOH abuse With alcohol withdrawals: Patient admits to drinking heavily, 6-8 merlot & bourbon every day.  - Mild symptoms of withdrawal; no DTs; no hallucinations;  - Continue on IV fluids, CIWA protocol with ativan, thiamine, folate 4. HTN (hypertension) Currently stable  5. Hyperlipidemia- hold statins due to transaminitis  6. Transaminitis: Likely from alcohol, hold statins  -per patient recent liver US: fatty liver; cont OP follow up  7. Obstructive sleep apnea  CPAP at night   Code Status: full Family Communication:  D/w patient (indicate person spoken with, relationship, and if by phone, the number) Disposition Plan: home 24-48 hrs    Consultants:  GI  Procedures:  none  Antibiotics:  None  (indicate start date, and stop date if known)  HPI/Subjective: alert  Objective: Filed Vitals:   08/03/14 0852  BP: 178/82  Pulse: 91  Temp: 98.4 F (36.9 C)  Resp: 23    Intake/Output Summary (Last 24 hours) at 08/03/14 0940 Last data filed at 08/03/14 0800  Gross per 24 hour  Intake 2244.67 ml  Output   3150 ml  Net -905.33 ml   Filed Weights   07/31/14 2345 08/01/14 0544 08/03/14 0523   Weight: 83.416 kg (183 lb 14.4 oz) 83.416 kg (183 lb 14.4 oz) 83.734 kg (184 lb 9.6 oz)    Exam:   General:  alert  Cardiovascular: s1,s2 rrr  Respiratory: CTA BL  Abdomen: soft, nt,nd   Musculoskeletal: no LE edema   Data Reviewed: Basic Metabolic Panel:  Recent Labs Lab 07/29/14 1938 07/31/14 2030 08/01/14 0610 08/02/14 0340  NA 130* 126* 133* 135*  K 4.1 3.7 4.1 4.5  CL 97 85* 92* 96  CO2  --  19 22 23   GLUCOSE 92 114* 74 85  BUN 19 11 9 8   CREATININE 1.70* 0.83 0.85 0.87  CALCIUM  --  9.1 9.2 9.2   Liver Function Tests:  Recent Labs Lab 07/31/14 2030 08/01/14 0610  AST 169* 156*  ALT 127* 117*  ALKPHOS 64 57  BILITOT 0.5 0.5  PROT 7.5 6.6  ALBUMIN 4.2 3.7   No results found for this basename: LIPASE, AMYLASE,  in the last 168 hours No results found for this basename: AMMONIA,  in the last 168 hours CBC:  Recent Labs Lab 07/29/14 1938 07/31/14 2030 08/01/14 0610 08/02/14 0340 08/03/14 0627  WBC  --  5.5 4.6 6.8 7.1  NEUTROABS  --  3.8  --   --   --   HGB 11.2* 8.2* 7.7* 9.8* 9.4*  HCT 33.0* 27.3* 25.9* 31.7* 30.9*  MCV  --  66.4* 66.8* 71.2* 71.9*  PLT  --  234 243 201 195  Cardiac Enzymes: No results found for this basename: CKTOTAL, CKMB, CKMBINDEX, TROPONINI,  in the last 168 hours BNP (last 3 results) No results found for this basename: PROBNP,  in the last 8760 hours CBG:  Recent Labs Lab 08/03/14 0006  GLUCAP 127*    No results found for this or any previous visit (from the past 240 hour(s)).   Studies: No results found.  Scheduled Meds: . allopurinol  100 mg Oral BID  . amLODipine  5 mg Oral Daily  . fluticasone  2 spray Each Nare QHS  . folic acid  1 mg Oral Daily  . irbesartan  150 mg Oral Daily  . lidocaine-prilocaine  1 application Topical Once  . loratadine  10 mg Oral Daily  . LORazepam  0-4 mg Intravenous Q6H   Followed by  . LORazepam  0-4 mg Intravenous Q12H  . multivitamin with minerals  1 tablet Oral  Daily  . omeprazole  20 mg Oral QHS  . oxymetazoline  2 spray Each Nare Once  . sodium chloride  3 mL Intravenous Q12H  . thiamine  100 mg Oral Daily   Or  . thiamine  100 mg Intravenous Daily   Continuous Infusions: . sodium chloride 100 mL/hr at 08/03/14 0236  . lactated ringers 1,000 mL (08/03/14 0855)    Principal Problem:   Anemia Active Problems:   Hyponatremia   ETOH abuse   HTN (hypertension)   Alcohol withdrawal   Hypertension   Hyperlipidemia   GERD (gastroesophageal reflux disease)   Obstructive sleep apnea   Diabetes mellitus without complication   Hemorrhoids    Time spent: >35 minutes     Esperanza SheetsBURIEV, Teleshia Lemere N  [facility] Pager [phone]. If 7PM-7AM, please contact night-coverage at [email or web address], password [email or web address] 08/03/2014, 9:40 AM  LOS: 3 days

## 2014-08-03 NOTE — Progress Notes (Signed)
Patient arrived from ENDO. He denied any pain and appears in no distress. Order to to d/c TELE from Dr. York SpanielBuriev. Diet order resume. Will continue to monitor.  Sim BoastHavy, RN

## 2014-08-03 NOTE — Op Note (Signed)
[facility] [address] [place], [id number]   ENDOSCOPY PROCEDURE REPORT  PATIENT: Raymond DroneOates, Benjermin S.  MR#: [id number] BIRTHDATE: [birth year] , 53  yrs. old GENDER: Male ENDOSCOPIST:Sael Furches Madilyn FiremanHayes, MD REFERRED BY: PROCEDURE DATE:  08/03/2014 PROCEDURE: ASA CLASS: INDICATIONS: anemia, nausea vomiting and hiccups MEDICATION:    MAC TOPICAL ANESTHETIC:   Cetacaine  DESCRIPTION OF PROCEDURE:   esophagus: Minimal sliding hiatal hernia otherwise normal Stomach: Mild antral gastritis with no focal erosions or ulcers, CLOtest obtained Duodenum: Normal ALT and second portion     COMPLICATIONS: None  ENDOSCOPIC IMPRESSION:antral gastritis, small hiatal hernia  RECOMMENDATIONS:treat with PPI and await CLOtest and treat for eradication of Helicobacter if positive.    _______________________________ Rosalie DoctoreSignedDorena Cookey:  Maddalynn Barnard, MD 08/03/2014 10:09 AM

## 2014-08-03 NOTE — Transfer of Care (Signed)
Immediate Anesthesia Transfer of Care Note  Patient: Raymond Hutchinson  Procedure(s) Performed: Procedure(s): ESOPHAGOGASTRODUODENOSCOPY (EGD) (N/A)  Patient Location: Endoscopy Unit  Anesthesia Type:MAC  Level of Consciousness: awake, alert , oriented and patient cooperative  Airway & Oxygen Therapy: Patient Spontanous Breathing  Post-op Assessment: Report given to PACU RN and Post -op Vital signs reviewed and stable  Post vital signs: Reviewed and stable  Complications: No apparent anesthesia complications

## 2014-08-03 NOTE — Anesthesia Postprocedure Evaluation (Signed)
  Anesthesia Post-op Note  Patient: Felipe DroneChristopher S Breach  Procedure(s) Performed: Procedure(s): ESOPHAGOGASTRODUODENOSCOPY (EGD) (N/A)  Patient Location: Endoscopy Unit  Anesthesia Type:MAC  Level of Consciousness: awake, alert , oriented and patient cooperative  Airway and Oxygen Therapy: Patient Spontanous Breathing  Post-op Pain: none  Post-op Assessment: Post-op Vital signs reviewed, Patient's Cardiovascular Status Stable, Respiratory Function Stable, Patent Airway and No signs of Nausea or vomiting  Post-op Vital Signs: Reviewed and stable  Last Vitals:  Filed Vitals:   08/03/14 0852  BP: 178/82  Pulse: 91  Temp: 36.9 C  Resp: 23    Complications: No apparent anesthesia complications

## 2014-08-03 NOTE — Progress Notes (Signed)
[facility] Progress Note  Subjective: Feels okay, no signs of DTs.  Objective: Vital signs in last 24 hours: Temp:  [98.1 F (36.7 C)-98.8 F (37.1 C)] 98.4 F (36.9 C) (08/06 0852) Pulse Rate:  [89-101] 91 (08/06 0852) Resp:  [18-23] 23 (08/06 0852) BP: (135-178)/(69-82) 178/82 mmHg (08/06 0852) SpO2:  [97 %-98 %] 97 % (08/06 0852) Weight:  [83.734 kg (184 lb 9.6 oz)] 83.734 kg (184 lb 9.6 oz) (08/06 0523) Weight change:    PE: Abdomen soft nondistended  Lab Results: Results for orders placed during the hospital encounter of 07/31/14 (from the past 24 hour(s))  GLUCOSE, CAPILLARY     Status: Abnormal   Collection Time    08/03/14 12:06 AM      Result Value Ref Range   Glucose-Capillary 127 (*) 70 - 99 mg/dL  CBC     Status: Abnormal   Collection Time    08/03/14  6:27 AM      Result Value Ref Range   WBC 7.1  4.0 - 10.5 K/uL   RBC 4.30  4.22 - 5.81 MIL/uL   Hemoglobin 9.4 (*) 13.0 - 17.0 g/dL   HCT 40.930.9 (*) 81.139.0 - 91.452.0 %   MCV 71.9 (*) 78.0 - 100.0 fL   MCH 21.9 (*) 26.0 - 34.0 pg   MCHC 30.4  30.0 - 36.0 g/dL   RDW 78.219.3 (*) 95.611.5 - 21.315.5 %   Platelets 195  150 - 400 K/uL    Studies/Results: EGD showed minimal sliding hiatal hernia mild antral gastritis with no focal ulcers or erosions, CLOtest obtained   Assessment: Anemia nausea vomiting and hiccups, minimal findings on EGD and fairly recent colonoscopy  Plan:  treat with proton pump inhibitor, advance diet as tolerated, await CLOtest and treat for eradication of Helicobacter if positive.    Undine Nealis C 08/03/2014, 10:10 AM

## 2014-08-03 NOTE — Interval H&P Note (Signed)
History and Physical Interval Note:  08/03/2014 9:47 AM  Raymond Hutchinson  has presented today for surgery, with the diagnosis of anemia, nasea vomit  The various methods of treatment have been discussed with the patient and family. After consideration of risks, benefits and other options for treatment, the patient has consented to  Procedure(s): ESOPHAGOGASTRODUODENOSCOPY (EGD) (N/A) as a surgical intervention .  The patient's history has been reviewed, patient examined, no change in status, stable for surgery.  I have reviewed the patient's chart and labs.  Questions were answered to the patient's satisfaction.     Carine Nordgren C

## 2014-08-03 NOTE — H&P (View-Only) (Signed)
[facility] PROGRESS NOTE  SUMEDH SHINSATO ZOX:[id number] DOB: 1961-09-17 DOA: 07/31/2014 PCP: Lupita Raider, MD  Assessment/Plan: 53 y.o. Male with alcoholism who presents to the ED with c/o generalized weakness and fatigue for the past 2 weeks. He saw Dr. Valentina Lucks in the office and was found to have hyponatremia and anemia on labs before being sent in to the ED.   1. 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 Dulce Sellar - Hg improved after 2 units of packed RBCs 8/4; Monitor Hg TF prn;  - per GI EGD in AM 8/6  2. Hyponatremia: improving, poor appetite in the last 2 weeks with intermittent nausea and vomiting -improved on IVF;  3. ETOH abuse With alcohol withdrawals: Patient admits to drinking heavily, 6-8 merlot & bourbon every day.  - Mild symptoms of withdrawal; no DTs; no hallucinations;  - Continue on IV fluids, CIWA protocol with ativan, thiamine, folate 4. HTN (hypertension) Currently stable  5. Hyperlipidemia- hold statins due to transaminitis  6. Transaminitis: Likely from alcohol, hold statins  -per patient recent liver US: fatty liver; cont OP follow up  7. Obstructive sleep apnea  CPAP at night   Code Status: full Family Communication:  D/w patient (indicate person spoken with, relationship, and if by phone, the number) Disposition Plan: home 24-48 hrs    Consultants:  GI  Procedures:  none  Antibiotics:  None  (indicate start date, and stop date if known)  HPI/Subjective: alert  Objective: Filed Vitals:   08/03/14 0852  BP: 178/82  Pulse: 91  Temp: 98.4 F (36.9 C)  Resp: 23    Intake/Output Summary (Last 24 hours) at 08/03/14 0940 Last data filed at 08/03/14 0800  Gross per 24 hour  Intake 2244.67 ml  Output   3150 ml  Net -905.33 ml   Filed Weights   07/31/14 2345 08/01/14 0544 08/03/14 0523   Weight: 83.416 kg (183 lb 14.4 oz) 83.416 kg (183 lb 14.4 oz) 83.734 kg (184 lb 9.6 oz)    Exam:   General:  alert  Cardiovascular: s1,s2 rrr  Respiratory: CTA BL  Abdomen: soft, nt,nd   Musculoskeletal: no LE edema   Data Reviewed: Basic Metabolic Panel:  Recent Labs Lab 07/29/14 1938 07/31/14 2030 08/01/14 0610 08/02/14 0340  NA 130* 126* 133* 135*  K 4.1 3.7 4.1 4.5  CL 97 85* 92* 96  CO2  --  19 22 23   GLUCOSE 92 114* 74 85  BUN 19 11 9 8   CREATININE 1.70* 0.83 0.85 0.87  CALCIUM  --  9.1 9.2 9.2   Liver Function Tests:  Recent Labs Lab 07/31/14 2030 08/01/14 0610  AST 169* 156*  ALT 127* 117*  ALKPHOS 64 57  BILITOT 0.5 0.5  PROT 7.5 6.6  ALBUMIN 4.2 3.7   No results found for this basename: LIPASE, AMYLASE,  in the last 168 hours No results found for this basename: AMMONIA,  in the last 168 hours CBC:  Recent Labs Lab 07/29/14 1938 07/31/14 2030 08/01/14 0610 08/02/14 0340 08/03/14 0627  WBC  --  5.5 4.6 6.8 7.1  NEUTROABS  --  3.8  --   --   --   HGB 11.2* 8.2* 7.7* 9.8* 9.4*  HCT 33.0* 27.3* 25.9* 31.7* 30.9*  MCV  --  66.4* 66.8* 71.2* 71.9*  PLT  --  234 243 201 195  Cardiac Enzymes: No results found for this basename: CKTOTAL, CKMB, CKMBINDEX, TROPONINI,  in the last 168 hours BNP (last 3 results) No results found for this basename: PROBNP,  in the last 8760 hours CBG:  Recent Labs Lab 08/03/14 0006  GLUCAP 127*    No results found for this or any previous visit (from the past 240 hour(s)).   Studies: No results found.  Scheduled Meds: . allopurinol  100 mg Oral BID  . amLODipine  5 mg Oral Daily  . fluticasone  2 spray Each Nare QHS  . folic acid  1 mg Oral Daily  . irbesartan  150 mg Oral Daily  . lidocaine-prilocaine  1 application Topical Once  . loratadine  10 mg Oral Daily  . LORazepam  0-4 mg Intravenous Q6H   Followed by  . LORazepam  0-4 mg Intravenous Q12H  . multivitamin with minerals  1 tablet Oral  Daily  . omeprazole  20 mg Oral QHS  . oxymetazoline  2 spray Each Nare Once  . sodium chloride  3 mL Intravenous Q12H  . thiamine  100 mg Oral Daily   Or  . thiamine  100 mg Intravenous Daily   Continuous Infusions: . sodium chloride 100 mL/hr at 08/03/14 0236  . lactated ringers 1,000 mL (08/03/14 0855)    Principal Problem:   Anemia Active Problems:   Hyponatremia   ETOH abuse   HTN (hypertension)   Alcohol withdrawal   Hypertension   Hyperlipidemia   GERD (gastroesophageal reflux disease)   Obstructive sleep apnea   Diabetes mellitus without complication   Hemorrhoids    Time spent: >35 minutes     Esperanza SheetsBURIEV, Derotha Fishbaugh N  [facility] Pager [phone]. If 7PM-7AM, please contact night-coverage at [email or web address], password [email or web address] 08/03/2014, 9:40 AM  LOS: 3 days

## 2014-08-04 ENCOUNTER — Encounter (HOSPITAL_COMMUNITY): Payer: Self-pay | Admitting: Gastroenterology

## 2014-08-04 LAB — CLOTEST (H. PYLORI), BIOPSY: Helicobacter screen: NEGATIVE

## 2014-08-04 MED ORDER — OMEPRAZOLE MAGNESIUM 20 MG PO TBEC: 40.0000 mg | DELAYED_RELEASE_TABLET | Freq: Every day | ORAL | Status: AC

## 2014-08-04 MED ORDER — FERROUS SULFATE 325 (65 FE) MG PO TABS: 325.0000 mg | ORAL_TABLET | Freq: Three times a day (TID) | ORAL | Status: AC

## 2014-08-04 NOTE — Progress Notes (Addendum)
Pt discharged to home per MD order. Pt received and reviewed all discharge instructions and medication information including follow-up appointments and prescription information. Pt verbalized understanding. Pt alert and oriented at discharge with no complaints of pain. Pt IV and telemetry box removed prior to discharge. Pt escorted to private vehicle via wheelchair by guest services volunteer. Clement Deneault C  

## 2014-08-04 NOTE — Discharge Summary (Addendum)
Physician Discharge Summary  Raymond Hutchinson ZOX:[id number] DOB: 01/01/1961 DOA: 07/31/2014  PCP: Lupita Raider, MD  Admit date: 07/31/2014 Discharge date: 08/04/2014  Time spent: >35 minutes  Recommendations for Outpatient Follow-up:  follow up with gastroenterologist in 1-2 weeks  follow up with primary care doctor in 1 week  follow up with surgery  in 1-2 week   Discharge Diagnoses:  Principal Problem:   Anemia Active Problems:   Hyponatremia   ETOH abuse   HTN (hypertension)   Alcohol withdrawal   Hypertension   Hyperlipidemia   GERD (gastroesophageal reflux disease)   Obstructive sleep apnea   Diabetes mellitus without complication   Hemorrhoids   Discharge Condition: stable   Diet recommendation:  Heart healthy   Filed Weights   07/31/14 2345 08/01/14 0544 08/03/14 0523  Weight: 83.416 kg (183 lb 14.4 oz) 83.416 kg (183 lb 14.4 oz) 83.734 kg (184 lb 9.6 oz)    History of present illness:  53 y.o. Male with alcoholism who presents to the ED with c/o generalized weakness and fatigue for the past 2 weeks. He saw Dr. Valentina Lucks in the office and was found to have hyponatremia and anemia on labs before being sent in to the ED.    Hospital Course:  1. Acute blood loss anemia likely due to chronic hemorrhoids; IDA  - Per patient, had colonoscopy last year (normal per patient), EGD 10 years ago had a sufficient stricture, follows Dr. Dulce Sellar  - Hg improved after 2 units of packed RBCs  - Pt underwent EGD 8/6: antral gastritis, small hiatal hernia; recommended to cont PPI, pend H pylori test; f/u with GI test results in 1-2 weeks  2. Hyponatremia: resolved, poor appetite in the last 2 weeks with intermittent nausea and vomiting  3. ETOH abuse With alcohol withdrawals: Patient admits to drinking heavily, 6-8 merlot & bourbon every day.  - no DTs; no hallucinations; on IV fluids, monitored CIWA protocol with ativan, thiamine, folate - [facility] meeting on Monday   4. HTN  (hypertension) Currently stable  5. Hyperlipidemia- resume statins; transaminates likely due to [facility]; repeat labs in 1 week  6. Transaminitis: Likely from alcohol-per patient recent liver US: fatty liver; cont OP follow up  7. Obstructive sleep apnea CPAP at night         Procedures:  EGD (i.e. Studies not automatically included, echos, thoracentesis, etc; not x-rays)  Consultations:  GI  Discharge Exam: Filed Vitals:   08/04/14 0552  BP: 147/84  Pulse: 90  Temp: 98.2 F (36.8 C)  Resp: 18    General: alert Cardiovascular: s1,s2 rrr Respiratory: CTA BL  Discharge Instructions  Discharge Instructions   Diet - low sodium heart healthy    Complete by:  As directed      Discharge instructions    Complete by:  As directed   Please follow up with gastroenterologist in 1-2 weeks  Please follow up with primary care doctor in 1 week     Increase activity slowly    Complete by:  As directed             Medication List    STOP taking these medications       IRON COMPLEX PO      TAKE these medications       allopurinol 100 MG tablet  Commonly known as:  ZYLOPRIM  Take 100 mg by mouth 2 (two) times daily.     aspirin 81 MG tablet  Take 81 mg by mouth daily.  ferrous sulfate 325 (65 FE) MG tablet  Take 1 tablet (325 mg total) by mouth 3 (three) times daily with meals.     loratadine 10 MG tablet  Commonly known as:  CLARITIN  Take 10 mg by mouth daily.     mometasone 50 MCG/ACT nasal spray  Commonly known as:  NASONEX  Place 2 sprays into the nose daily.     multivitamin tablet  Take 1 tablet by mouth daily.     omeprazole 20 MG tablet  Commonly known as:  PRILOSEC OTC  Take 2 tablets (40 mg total) by mouth daily.     simvastatin 20 MG tablet  Commonly known as:  ZOCOR  Take 20 mg by mouth at bedtime.     TRIBENZOR 40-10-25 MG Tabs  Generic drug:  Olmesartan-Amlodipine-HCTZ  Take 1 tablet by mouth daily. 1/2 tablet daily.        Allergies  Allergen Reactions  . Penicillins Anaphylaxis       Follow-up Information   Follow up with SHAW,KIMBERLEE, MD In 1 week.   Specialty:  Family Medicine   Contact information:   [address] [place] [address] [phone]        The results of significant diagnostics from this hospitalization (including imaging, microbiology, ancillary and laboratory) are listed below for reference.    Significant Diagnostic Studies: No results found.  Microbiology: No results found for this or any previous visit (from the past 240 hour(s)).   Labs: Basic Metabolic Panel:  Recent Labs Lab 07/29/14 1938 07/31/14 2030 08/01/14 0610 08/02/14 0340  NA 130* 126* 133* 135*  K 4.1 3.7 4.1 4.5  CL 97 85* 92* 96  CO2  --  19 22 23   GLUCOSE 92 114* 74 85  BUN 19 11 9 8   CREATININE 1.70* 0.83 0.85 0.87  CALCIUM  --  9.1 9.2 9.2   Liver Function Tests:  Recent Labs Lab 07/31/14 2030 08/01/14 0610  AST 169* 156*  ALT 127* 117*  ALKPHOS 64 57  BILITOT 0.5 0.5  PROT 7.5 6.6  ALBUMIN 4.2 3.7   No results found for this basename: LIPASE, AMYLASE,  in the last 168 hours No results found for this basename: AMMONIA,  in the last 168 hours CBC:  Recent Labs Lab 07/29/14 1938 07/31/14 2030 08/01/14 0610 08/02/14 0340 08/03/14 0627  WBC  --  5.5 4.6 6.8 7.1  NEUTROABS  --  3.8  --   --   --   HGB 11.2* 8.2* 7.7* 9.8* 9.4*  HCT 33.0* 27.3* 25.9* 31.7* 30.9*  MCV  --  66.4* 66.8* 71.2* 71.9*  PLT  --  234 243 201 195   Cardiac Enzymes: No results found for this basename: CKTOTAL, CKMB, CKMBINDEX, TROPONINI,  in the last 168 hours BNP: BNP (last 3 results) No results found for this basename: PROBNP,  in the last 8760 hours CBG:  Recent Labs Lab 08/03/14 0006  GLUCAP 127*       Signed:  Mekisha Bittel N  [facility] 08/04/2014, 9:19 AM

## 2014-08-04 NOTE — Discharge Instructions (Addendum)
Please follow up with gastroenterologist in 1-2 weeks  Please follow up with primary care doctor in 1 week

## 2014-08-04 NOTE — Progress Notes (Signed)
[facility] Progress Note  Subjective: Feels better no tremulousness no nausea vomiting and hiccups.  Objective: Vital signs in last 24 hours: Temp:  [97.5 F (36.4 C)-99 F (37.2 C)] 98.2 F (36.8 C) (08/07 0552) Pulse Rate:  [71-96] 90 (08/07 0552) Resp:  [12-20] 18 (08/07 0552) BP: (139-154)/(72-94) 147/84 mmHg (08/07 0552) SpO2:  [94 %-99 %] 97 % (08/07 0552) Weight change:    PE: Unchanged  Lab Results: No results found for this or any previous visit (from the past 24 hour(s)).  Studies/Results: No results found.    Assessment: Anemia with heme-negative stool, Nausea and vomiting and hiccups No obvious cause for either by EGD.  Plan: Await CLOtest although suspect will be negative Probably reasonable to treat with proton pump inhibitor for 6 weeks for possible atypical reflux symptoms Patient signed up for behavioral health rehabilitation program for his alcohol abuse Patient to followup with surgeon regarding his hemorrhoid. Reportedly going home today. Will sign off for now.    Dai Mcadams C 08/04/2014, 8:56 AM

## 2014-08-07 ENCOUNTER — Other Ambulatory Visit (HOSPITAL_COMMUNITY): Payer: BC Managed Care – PPO | Attending: Psychiatry | Admitting: Psychology

## 2014-08-07 ENCOUNTER — Encounter (HOSPITAL_COMMUNITY): Payer: Self-pay | Admitting: Psychology

## 2014-08-07 DIAGNOSIS — Z7982 Long term (current) use of aspirin: Secondary | ICD-10-CM | POA: Insufficient documentation

## 2014-08-07 DIAGNOSIS — F1020 Alcohol dependence, uncomplicated: Secondary | ICD-10-CM | POA: Diagnosis not present

## 2014-08-07 DIAGNOSIS — G4733 Obstructive sleep apnea (adult) (pediatric): Secondary | ICD-10-CM | POA: Diagnosis not present

## 2014-08-07 DIAGNOSIS — D509 Iron deficiency anemia, unspecified: Secondary | ICD-10-CM | POA: Insufficient documentation

## 2014-08-07 DIAGNOSIS — E785 Hyperlipidemia, unspecified: Secondary | ICD-10-CM | POA: Insufficient documentation

## 2014-08-07 DIAGNOSIS — F10220 Alcohol dependence with intoxication, uncomplicated: Secondary | ICD-10-CM

## 2014-08-07 DIAGNOSIS — Z79899 Other long term (current) drug therapy: Secondary | ICD-10-CM | POA: Diagnosis not present

## 2014-08-07 DIAGNOSIS — I10 Essential (primary) hypertension: Secondary | ICD-10-CM | POA: Diagnosis not present

## 2014-08-07 DIAGNOSIS — K700 Alcoholic fatty liver: Secondary | ICD-10-CM | POA: Diagnosis not present

## 2014-08-07 DIAGNOSIS — E119 Type 2 diabetes mellitus without complications: Secondary | ICD-10-CM | POA: Insufficient documentation

## 2014-08-07 DIAGNOSIS — K219 Gastro-esophageal reflux disease without esophagitis: Secondary | ICD-10-CM | POA: Diagnosis not present

## 2014-08-09 ENCOUNTER — Encounter (HOSPITAL_COMMUNITY): Payer: Self-pay | Admitting: Psychology

## 2014-08-09 ENCOUNTER — Other Ambulatory Visit (HOSPITAL_COMMUNITY): Payer: Self-pay | Admitting: Medical

## 2014-08-09 ENCOUNTER — Other Ambulatory Visit (HOSPITAL_COMMUNITY): Payer: BC Managed Care – PPO | Admitting: Psychology

## 2014-08-09 DIAGNOSIS — F10239 Alcohol dependence with withdrawal, unspecified: Secondary | ICD-10-CM

## 2014-08-09 DIAGNOSIS — G4733 Obstructive sleep apnea (adult) (pediatric): Secondary | ICD-10-CM

## 2014-08-09 DIAGNOSIS — F1020 Alcohol dependence, uncomplicated: Secondary | ICD-10-CM | POA: Diagnosis not present

## 2014-08-09 DIAGNOSIS — F10982 Alcohol use, unspecified with alcohol-induced sleep disorder: Secondary | ICD-10-CM

## 2014-08-09 DIAGNOSIS — F10220 Alcohol dependence with intoxication, uncomplicated: Secondary | ICD-10-CM

## 2014-08-09 MED ORDER — TRAZODONE HCL 50 MG PO TABS: 50.0000 mg | ORAL_TABLET | Freq: Every evening | ORAL | Status: AC | PRN

## 2014-08-09 NOTE — Progress Notes (Signed)
    Daily Group Progress Note  Program: CD-IOP   Group Time: 1-2:30 pm  Participation Level: Active  Behavioral Response: Appropriate and Sharing  Type of Therapy: Process Group  Topic: Process: the first part of group was spent in process. Members shared bout the past weekend. They disclosed about the meetings they had attended and other activities that have supported their recovery as well as any challenges or temptations that may have presented themselves. One member had relapsed and she was asked to share the events that had led to her drinking. She did not respond to my request - I reminded group members that the sheer act of disclosing and speaking one's experience is therapeutic and, ultimately, healing.  Also present were 3 new group members. Each of the new members was invited to share a little bit about themselves with their new group members.   Group Time: 2:45- 4pm  Participation Level: Minimal  Behavioral Response: Sharing  Type of Therapy: Psycho-education Group  Topic: Cognitive Distortions: the second half of group was spent in a psycho-ed. Members were provided with a handout that identified different types of cognitive distortions. We read over the handout together and members commented on some of the distortions that they most frequently used. There was a good discussion and by sharing, members became more open and known among each other. During this group session, random drug tests were collected.   Summary: The patient was new to the group. He was attentive and engaged in the session and when invited to share, he was very willing. The patient reported he had [facility] the [facility] [place]. He had been experiencing some other problems, but they were linked to his drinking and he had decided it was time to change. The patient admitted that when he had told some of his family and closer friends, none of them were surprised. In fact, they admitted that  they were waiting for him to do something about his drinking. The patient reported his wife was very supportive, but they were concerned about his 54 yo daughter who recently married. They both believe that she is drinking too much and hoped that, somehow, he could get her here to the group or in a family session. The patient shared openly in the session and responded well to this first group session. His sobriety date is 8/4.    Family Program: Family present? No   Name of family member(s):   UDS collected: No Results:   AA/NA attended?: No patient is new to the program  Sponsor?: No   Sacred Roa, LCAS

## 2014-08-11 ENCOUNTER — Other Ambulatory Visit (HOSPITAL_COMMUNITY): Payer: BC Managed Care – PPO

## 2014-08-14 ENCOUNTER — Other Ambulatory Visit (HOSPITAL_COMMUNITY): Payer: BC Managed Care – PPO

## 2014-08-15 ENCOUNTER — Encounter (HOSPITAL_COMMUNITY): Payer: Self-pay | Admitting: Psychology

## 2014-08-15 NOTE — Progress Notes (Signed)
    Daily Group Progress Note  Program: CD-IOP   Group Time: 1-2:30 pm  Participation Level: Minimal  Behavioral Response: Sharing  Type of Therapy: Psycho-education Group  Topic: Chaplain: the first half of group was spent in a psycho-ed with a guest speaker. PW, a Chaplain with [employer] appeared and led a discussion on "Spirituality". The emphasis was on being present, acceptance and forgiveness. Members shared about their experiences and beliefs. When PW asked the group what they needed, a number of members agreed that they weren't sure that 'answers' would provide them with what they really needed. The session generated even more questions then before the session. During group today, the medical director, CK, met with 2 new group members and provided refills where needed.   Group Time: 2:45- 4pm  Participation Level: Active  Behavioral Response: Appropriate and Sharing  Type of Therapy: Process Group  Topic: Process: the second half of group was spent in process. Members shared about the past few days and any concerns or issues that may have challenged or questioned their sobriety. No one had relapsed since the last session. One member had not appeared yesterday for our individual appointment and I questioned why she had called and cancelled. She admitted her boyfriend had told her not to go. The group responded with disbelief. The importance of making choices and decisions in early recovery was emphasized. I also reminded members that if they didn't make the choices that were needed, others would make them for them. I disclosed that a guest facilitator would be present for the next two sessions and encouraged the group to engage as they always do.   Summary: The patient was attentive and shared about his own version of Spirituality. He described it as a 'Belief System'. That belief system teaches you how to function based on your beliefs. In process, the patient reported he had  attended an [place] meeting and had been warmly welcomed. He admitted he had seen an old friend the second he walked into the meeting and she had come up and hugged him. She hadn't said a word or expressed any surprise that he was there. The patient reminded his new group members that he had told everyone who was important to him and it had been very powerful - possibly more for him than for anyone else because no one seemed very surprised by the disclosure. The patient seemed more relaxed today in his second session and responded well to this intervention. His sobriety date remains 8/4. I confirmed with him that the medical director would send in the prescription for Trazadone and he could begin taking it tonight.    Family Program: Family present? No   Name of family member(s):   UDS collected: No Results:  AA/NA attended?: [place]  Sponsor?: No   Raymond Hutchinson, LCAS

## 2014-08-16 ENCOUNTER — Other Ambulatory Visit (HOSPITAL_COMMUNITY): Payer: BC Managed Care – PPO | Admitting: Psychology

## 2014-08-16 DIAGNOSIS — F10230 Alcohol dependence with withdrawal, uncomplicated: Secondary | ICD-10-CM

## 2014-08-16 DIAGNOSIS — G4733 Obstructive sleep apnea (adult) (pediatric): Secondary | ICD-10-CM

## 2014-08-16 DIAGNOSIS — F1020 Alcohol dependence, uncomplicated: Secondary | ICD-10-CM | POA: Diagnosis not present

## 2014-08-17 ENCOUNTER — Encounter (HOSPITAL_COMMUNITY): Payer: Self-pay | Admitting: Psychology

## 2014-08-17 DIAGNOSIS — F10230 Alcohol dependence with withdrawal, uncomplicated: Secondary | ICD-10-CM | POA: Insufficient documentation

## 2014-08-17 NOTE — Progress Notes (Signed)
    Daily Group Progress Note  Program: CD-IOP   Group Time: 1-2:30 pm  Participation Level: Active  Behavioral Response: Appropriate and Sharing  Type of Therapy: Process Group  Topic: Group Process: the first part of group was spent in process. Members shared about issues and concerns in early recovery. There was good feedback and discussion among the group. I had been gone for two sessions and there was some efforts to catch me up with events since last week. During this session, drug tests were collected from all group members.   Group Time: 2:45- 4PM  Participation Level: Active  Behavioral Response: Sharing  Type of Therapy: Psycho-education Group  Topic: "Step One"/Family: the second half of group was spent in a psycho-ed. As this half of group began, a member read her "Step One". It was a powerful reading and proved very emotional to the presenting group member. Later, members were provided handouts on the family dynamics in addicted or dysfunctional family systems. The different roles were discussed and members shared how they might have taken on some of these roles. The session proved very compelling and almost every member shared something about their family history.   Summary: The patient was attentive and shared openly in group today. He provided good feedback and agreed with other group members that being a freshman in college presents a lot of temptations.The patient reported that his daughter wasn't a party girl and she had searched out and  found a Investment banker, corporateChristian athletic group FAC. They did a a lot of fun things that didn't include getting drunk or high. The patient reported he had picked up his starter chip Monday evening at the 7 pm AA meeting. He still isn't sure which group will be his home group, but he was very welcomed and validated when he got the chip. In the psycho-ed, the patient reported he had 3 jobs when he was 53 yo. The importance of work and of giving back was  emphasized. He agreed with another member that when one didn't give their best, that would not be tolerated. When I asked about the effects of his drinking on his wife and daughters, the patient seemed confused and did not believe his drinking had caused them any problems or concerns. When I noted that the drinking can make other family members 'sick' he was completely certain that had not happened to his family. The patient agreed to ask his wife about that, though. The patient was unable to identify any roles that may have existed in his biological family or in his current family. We will continue to address these issues in future sessions and break through some of his denial. He continues to make good progress and the patient's sobriety date remains 8/4.    Family Program: Family present? No   Name of family member(s):   UDS collected: Yes Results: not back yet  AA/NA attended?: YesMonday  Sponsor?: No, but he is just getting familiar with AA and is going to different meetings   Martavius Lusty, LCAS
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
## 2014-08-18 ENCOUNTER — Other Ambulatory Visit (HOSPITAL_COMMUNITY): Payer: BC Managed Care – PPO | Admitting: Psychology

## 2014-08-18 ENCOUNTER — Encounter (HOSPITAL_COMMUNITY): Payer: Self-pay | Admitting: Psychology

## 2014-08-18 ENCOUNTER — Encounter (HOSPITAL_COMMUNITY): Payer: Self-pay

## 2014-08-18 DIAGNOSIS — G4733 Obstructive sleep apnea (adult) (pediatric): Secondary | ICD-10-CM

## 2014-08-18 DIAGNOSIS — F19982 Other psychoactive substance use, unspecified with psychoactive substance-induced sleep disorder: Secondary | ICD-10-CM | POA: Insufficient documentation

## 2014-08-18 DIAGNOSIS — F1020 Alcohol dependence, uncomplicated: Secondary | ICD-10-CM | POA: Diagnosis not present

## 2014-08-18 DIAGNOSIS — F10239 Alcohol dependence with withdrawal, unspecified: Secondary | ICD-10-CM
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
## 2014-08-21 ENCOUNTER — Encounter (HOSPITAL_COMMUNITY): Payer: Self-pay | Admitting: Psychology

## 2014-08-21 ENCOUNTER — Other Ambulatory Visit (HOSPITAL_COMMUNITY): Payer: BC Managed Care – PPO

## 2014-08-21 NOTE — Progress Notes (Signed)
    Daily Group Progress Note  Program: CD-IOP   Group Time: 1-2:30 pm  Participation Level: Active  Behavioral Response: Sharing  Type of Therapy: Process Group  Topic: Group Process: the first part of group was spent in process. Members shared about current issues and concerns in early recovery. One member had missed the last group session and disclosed that she had relapsed Wednesday. She cried as she recounted this and admitted she felt as if she had let the group down. Members assured her that this was not the case. The relapse was diagrammed on the board with feedback from members about what could have been done differently. The session proved very informative.  A new group member was present and she introduced herself during this part of group. During the group session, the program director was meeting with new group members.   Group Time: 2:45- 4pm  Participation Level: Minimal  Behavioral Response: Sharing  Type of Therapy: Psycho-education Group  Topic: Family Sculpture: the second part of group was spent in a psycho-ed. Members were asked to 'sculpt' their biological family. An explanation was provided and 3 family sculptures were completed. The session proved emotional for some of those members and provided good insight and more understanding about group members' lives and experiences.    Summary: The patient reported he is doing well. He had met with me just before the group session and noted he is doing as much as he can right now in his recovery. The patient met with the program director and missed most of the first half of group. In the family sculpture, he admitted he didn't know how he would 'sculpt' his family, but I encouraged him to think about it. The patient provided good feedback and responded well to this intervention. His sobriety date remains 8/4.    Family Program: Family present? No   Name of family member(s):   UDS collected: No Results:   AA/NA  attended?: YesThursday  Sponsor?: No, but he is seeking to secure a temporary sponsor   Ashten Sarnowski, LCAS

## 2014-08-23 ENCOUNTER — Other Ambulatory Visit (HOSPITAL_COMMUNITY): Payer: BC Managed Care – PPO | Admitting: Psychology

## 2014-08-23 DIAGNOSIS — G4733 Obstructive sleep apnea (adult) (pediatric): Secondary | ICD-10-CM

## 2014-08-23 DIAGNOSIS — F1020 Alcohol dependence, uncomplicated: Secondary | ICD-10-CM | POA: Diagnosis not present

## 2014-08-23 DIAGNOSIS — F10230 Alcohol dependence with withdrawal, uncomplicated: Secondary | ICD-10-CM

## 2014-08-25 ENCOUNTER — Other Ambulatory Visit (HOSPITAL_COMMUNITY): Payer: BC Managed Care – PPO

## 2014-08-28 ENCOUNTER — Encounter (HOSPITAL_COMMUNITY): Payer: Self-pay | Admitting: Psychology

## 2014-08-28 ENCOUNTER — Other Ambulatory Visit (HOSPITAL_COMMUNITY): Payer: BC Managed Care – PPO

## 2014-08-28 NOTE — Progress Notes (Signed)
    Daily Group Progress Note  Program: CD-IOP   Group Time: 1-2:30 pm  Participation Level: Active  Behavioral Response: Appropriate and Sharing  Type of Therapy: Process Group  Topic: Group process: the first half of group was spent in process. Members shared about current issues and concerns in early recovery. There were two new group members present today. The medical director was meeting with group members today and he asked to speak with them during this session.There were good comments and feedback during the half of group. Two drug tests were collected during this session.  Group Time: 2:45- 4pm  Participation Level: Minimal  Behavioral Response: Sharing  Type of Therapy: Psycho-education Group  Topic: Psycho-Ed: the second half of group was spent in a session on Family Sculpture. Group members picked other members to represent their families and they placed them in positions that represented a theme of their early life. Once again, as in the previous two sessions, the sculpture proved very powerful. There were strong emotions, including pain and hurt, witnessed by all during the sculpture. During this half of group, one of the new group members introduced herself and told her story. It proved very compelling and the new member displayed a depth of understanding that motivated those witnessing her story. The session proved very powerful.   Summary: The patient apologized for missing group on Monday. He explained that his mother-in-law had been in the hospital and then his father-in-law suddenly developed symptoms and was in another hospital near [place]. He expressed frustration that since then, the hospital was ready to discharge the mother-in-law, but she will need to be cared for and she cannot return home. Other members shared his frustrations. In the second half of group, the patient served in the family sculptures and provided good feedback. 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 He assured me that he would increase his AA meeting attendance. The patient made good comments during the family sculpture and he responded well to this intervention. His sobriety date remains 8/4.    Family Program: Family present? No   Name of family member(s):   UDS collected: No Results:   AA/NA attended?: YesSaturday  Sponsor?: No   Suleyma Wafer, LCAS

## 2014-08-29 NOTE — Progress Notes (Unsigned)
Raymond Hutchinson is a 53 y.o. male patient ***. CD-IOP: Treatment Planning Session. I met with the patient today before his CD-IOP group session. This represented our first individual therapy session and I explained the need to identify goals for treatment. The patient was very pleasant and agreeable. He confirmed that remaining alcohol-free is his first goal and building a strong support network is a second one. The patient reminded me that he has some friends and co-workers that are in recovery and they are aware of his treatment and very supportive of his ongoing sobriety. When asked about any other goals, the patient admitted that he wanted to address his spirituality. He noted that despite his beliefs, he has fallen out of any consistent church attendance or even daily spiritual practice. The patient recognized that his drinking was not consistent with what he beliefs in and he would like to re-engage in a daily spiritual and religious practice. He noted that attending Sunday church, going to AA meetings where there is a lot of spirituality, and reading are ways he can reconnect with this part of himself he has lost. 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 He admitted it is a huge undertaking and requiring a lot of his time and attention. He assured me he would get to as many meetings as he possibly could, but insisted that he could only do so much and the demands on his time from this program were already significant. I encouraged him to do the best he could and to insure he was observing good self-care. The documentation was reviewed, signed and completed accordingly. We will continue to follow closely in the days ahead.        EVANS,ANN, LCAS 

## 2014-08-30 ENCOUNTER — Other Ambulatory Visit (HOSPITAL_COMMUNITY): Payer: BC Managed Care – PPO | Attending: Psychiatry

## 2014-08-30 NOTE — Progress Notes (Unsigned)
Raymond Hutchinson is a 53 y.o. male patient ***. CD-IOP: Discharge. The patient has decided to withdraw from the program. 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 The patient will be discharged from the program.        Marzella Miracle, LCAS

## 2014-09-01 ENCOUNTER — Other Ambulatory Visit (HOSPITAL_COMMUNITY): Payer: BC Managed Care – PPO

## 2014-09-06 ENCOUNTER — Other Ambulatory Visit (HOSPITAL_COMMUNITY): Payer: BC Managed Care – PPO

## 2014-09-08 ENCOUNTER — Other Ambulatory Visit (HOSPITAL_COMMUNITY): Payer: BC Managed Care – PPO

## 2014-09-11 ENCOUNTER — Other Ambulatory Visit (HOSPITAL_COMMUNITY): Payer: BC Managed Care – PPO

## 2014-09-13 ENCOUNTER — Other Ambulatory Visit (HOSPITAL_COMMUNITY): Payer: BC Managed Care – PPO

## 2014-09-15 ENCOUNTER — Other Ambulatory Visit (HOSPITAL_COMMUNITY): Payer: BC Managed Care – PPO

## 2014-09-18 ENCOUNTER — Other Ambulatory Visit (HOSPITAL_COMMUNITY): Payer: BC Managed Care – PPO

## 2014-09-20 ENCOUNTER — Other Ambulatory Visit (HOSPITAL_COMMUNITY): Payer: BC Managed Care – PPO

## 2014-09-22 ENCOUNTER — Other Ambulatory Visit (HOSPITAL_COMMUNITY): Payer: BC Managed Care – PPO

## 2014-09-25 ENCOUNTER — Other Ambulatory Visit (HOSPITAL_COMMUNITY): Payer: BC Managed Care – PPO

## 2014-09-27 ENCOUNTER — Other Ambulatory Visit (HOSPITAL_COMMUNITY): Payer: BC Managed Care – PPO

## 2014-09-28 ENCOUNTER — Encounter (HOSPITAL_COMMUNITY): Payer: Self-pay

## 2014-09-28 NOTE — Progress Notes (Unsigned)
    Daily Group Progress Note  Program: CD-IOP   Group Time: 1-2:30 pm  Participation Level: Active  Behavioral Response: Sharing  Type of Therapy: Psycho-education Group  Topic: Triggers: the first part of group was spent in a psycho-ed on "Triggers". Members identified some of their triggers and a discussion followed on addressing these triggers. There was good disclosure and feedback among group members.  Group Time: 2:45- 4pm  Participation Level: Active  Behavioral Response: Sharing  Type of Therapy: Psycho-education Group  Topic: Refusal Skills: the second half of group was spent discussing handouts and refusal skills. Members read from the handouts and discussed the options available when confronted or feeling pressured to use. Members challenged some of the examples, but the ensuing discussion was able to put them into perspective.   Summary: Patient was present for group and participated. Patient reported that his wife has been a major support in his recovery.  Patient reported that he has been tempted while at the pool and he was able to say no to a drink. The patient reported that his biggest surprise was realizing that drinking had become a part of his work culture and he now had to find activities to occupy the time he drank at the office with others. Patient participated in reading and discussing the handouts "Saying No: Cases to Consider" and "Some Suggestions About Saying No." Patient reported that he is motivated to say no due to the support of his wife. Patient reported that he and his wife talked through his triggers and past hobbies while he was still in the hospital. Patient reported that was a pivotal moment for him due to realizing how many activities and hobbies that he no longer participated in due to his addiction. The patient made some good comments and responded well to this intervention.    Family Program: Family present? No   Name of family member(s):    UDS collected: No Results:   AA/NA attended?: No  Sponsor?: No   [facility]

## 2014-09-29 ENCOUNTER — Other Ambulatory Visit (HOSPITAL_COMMUNITY): Payer: BC Managed Care – PPO

## 2014-10-02 ENCOUNTER — Other Ambulatory Visit (HOSPITAL_COMMUNITY): Payer: BC Managed Care – PPO

## 2014-10-04 ENCOUNTER — Other Ambulatory Visit (HOSPITAL_COMMUNITY): Payer: BC Managed Care – PPO

## 2014-10-06 ENCOUNTER — Other Ambulatory Visit (HOSPITAL_COMMUNITY): Payer: BC Managed Care – PPO

## 2014-10-09 ENCOUNTER — Other Ambulatory Visit (HOSPITAL_COMMUNITY): Payer: BC Managed Care – PPO

## 2014-10-11 ENCOUNTER — Other Ambulatory Visit (HOSPITAL_COMMUNITY): Payer: BC Managed Care – PPO

## 2014-10-13 ENCOUNTER — Other Ambulatory Visit (HOSPITAL_COMMUNITY): Payer: BC Managed Care – PPO

## 2014-10-16 ENCOUNTER — Other Ambulatory Visit (HOSPITAL_COMMUNITY): Payer: BC Managed Care – PPO

## 2014-10-18 ENCOUNTER — Other Ambulatory Visit (HOSPITAL_COMMUNITY): Payer: BC Managed Care – PPO

## 2014-10-20 ENCOUNTER — Other Ambulatory Visit (HOSPITAL_COMMUNITY): Payer: BC Managed Care – PPO

## 2014-10-23 ENCOUNTER — Other Ambulatory Visit (HOSPITAL_COMMUNITY): Payer: BC Managed Care – PPO

## 2014-10-25 ENCOUNTER — Other Ambulatory Visit (HOSPITAL_COMMUNITY): Payer: BC Managed Care – PPO

## 2014-10-27 ENCOUNTER — Other Ambulatory Visit (HOSPITAL_COMMUNITY): Payer: BC Managed Care – PPO

## 2015-11-16 ENCOUNTER — Encounter (HOSPITAL_COMMUNITY): Payer: Self-pay | Admitting: Emergency Medicine

## 2015-11-16 ENCOUNTER — Emergency Department (HOSPITAL_COMMUNITY)
Admission: EM | Admit: 2015-11-16 | Discharge: 2015-11-16 | Disposition: A | Discharge status: Home / Self Care | Payer: BLUE CROSS/BLUE SHIELD | Attending: Emergency Medicine | Admitting: Emergency Medicine

## 2015-11-16 ENCOUNTER — Emergency Department (HOSPITAL_COMMUNITY): Payer: BLUE CROSS/BLUE SHIELD

## 2015-11-16 DIAGNOSIS — Z7982 Long term (current) use of aspirin: Secondary | ICD-10-CM | POA: Diagnosis not present

## 2015-11-16 DIAGNOSIS — F10120 Alcohol abuse with intoxication, uncomplicated: Secondary | ICD-10-CM | POA: Diagnosis not present

## 2015-11-16 DIAGNOSIS — K219 Gastro-esophageal reflux disease without esophagitis: Secondary | ICD-10-CM | POA: Insufficient documentation

## 2015-11-16 DIAGNOSIS — W1839XA Other fall on same level, initial encounter: Secondary | ICD-10-CM | POA: Diagnosis not present

## 2015-11-16 DIAGNOSIS — Y998 Other external cause status: Secondary | ICD-10-CM | POA: Diagnosis not present

## 2015-11-16 DIAGNOSIS — E119 Type 2 diabetes mellitus without complications: Secondary | ICD-10-CM | POA: Diagnosis not present

## 2015-11-16 DIAGNOSIS — R000 Tachycardia, unspecified: Secondary | ICD-10-CM | POA: Insufficient documentation

## 2015-11-16 DIAGNOSIS — Z79899 Other long term (current) drug therapy: Secondary | ICD-10-CM | POA: Insufficient documentation

## 2015-11-16 DIAGNOSIS — D649 Anemia, unspecified: Secondary | ICD-10-CM | POA: Insufficient documentation

## 2015-11-16 DIAGNOSIS — Y9389 Activity, other specified: Secondary | ICD-10-CM | POA: Insufficient documentation

## 2015-11-16 DIAGNOSIS — G4733 Obstructive sleep apnea (adult) (pediatric): Secondary | ICD-10-CM | POA: Insufficient documentation

## 2015-11-16 DIAGNOSIS — Z7951 Long term (current) use of inhaled steroids: Secondary | ICD-10-CM | POA: Diagnosis not present

## 2015-11-16 DIAGNOSIS — E785 Hyperlipidemia, unspecified: Secondary | ICD-10-CM | POA: Insufficient documentation

## 2015-11-16 DIAGNOSIS — S50812A Abrasion of left forearm, initial encounter: Secondary | ICD-10-CM | POA: Diagnosis not present

## 2015-11-16 DIAGNOSIS — I10 Essential (primary) hypertension: Secondary | ICD-10-CM | POA: Insufficient documentation

## 2015-11-16 DIAGNOSIS — F10129 Alcohol abuse with intoxication, unspecified: Secondary | ICD-10-CM | POA: Diagnosis present

## 2015-11-16 DIAGNOSIS — Y9289 Other specified places as the place of occurrence of the external cause: Secondary | ICD-10-CM | POA: Insufficient documentation

## 2015-11-16 DIAGNOSIS — Z9981 Dependence on supplemental oxygen: Secondary | ICD-10-CM | POA: Diagnosis not present

## 2015-11-16 DIAGNOSIS — Z880 Allergy status to penicillin: Secondary | ICD-10-CM | POA: Diagnosis not present

## 2015-11-16 DIAGNOSIS — F10920 Alcohol use, unspecified with intoxication, uncomplicated: Secondary | ICD-10-CM

## 2015-11-16 LAB — COMPREHENSIVE METABOLIC PANEL
ALK PHOS: 62 U/L (ref 38–126)
ALT: 22 U/L (ref 17–63)
ANION GAP: 14 (ref 5–15)
AST: 28 U/L (ref 15–41)
Albumin: 4.3 g/dL (ref 3.5–5.0)
BILIRUBIN TOTAL: 0.6 mg/dL (ref 0.3–1.2)
BUN: 13 mg/dL (ref 6–20)
CALCIUM: 8.6 mg/dL — AB (ref 8.9–10.3)
CO2: 23 mmol/L (ref 22–32)
Chloride: 102 mmol/L (ref 101–111)
Creatinine, Ser: 0.81 mg/dL (ref 0.61–1.24)
GFR calc Af Amer: >60 mL/min (ref >60)
GLUCOSE: 153 mg/dL — AB (ref 65–99)
POTASSIUM: 3.5 mmol/L (ref 3.5–5.1)
Sodium: 139 mmol/L (ref 135–145)
TOTAL PROTEIN: 7.4 g/dL (ref 6.5–8.1)

## 2015-11-16 LAB — CBC
HEMATOCRIT: 41.2 % (ref 39.0–52.0)
HEMOGLOBIN: 14 g/dL (ref 13.0–17.0)
MCH: 30 pg (ref 26.0–34.0)
MCHC: 34 g/dL (ref 30.0–36.0)
MCV: 88.4 fL (ref 78.0–100.0)
Platelets: 254 10*3/uL (ref 150–400)
RBC: 4.66 MIL/uL (ref 4.22–5.81)
RDW: 13.6 % (ref 11.5–15.5)
WBC: 9.2 10*3/uL (ref 4.0–10.5)

## 2015-11-16 LAB — SALICYLATE LEVEL

## 2015-11-16 LAB — ACETAMINOPHEN LEVEL

## 2015-11-16 LAB — ETHANOL: ALCOHOL ETHYL (B): 353 mg/dL — AB (ref <5)

## 2015-11-16 LAB — CBG MONITORING, ED: GLUCOSE-CAPILLARY: 140 mg/dL — AB (ref 65–99)

## 2015-11-16 LAB — I-STAT TROPONIN, ED: TROPONIN I, POC: 0.01 ng/mL (ref 0.00–0.08)

## 2015-11-16 MED ORDER — DISULFIRAM 250 MG PO TABS: 250.0000 mg | ORAL_TABLET | Freq: Every day | ORAL | Status: AC

## 2015-11-16 MED ORDER — SODIUM CHLORIDE 0.9 % IV BOLUS (SEPSIS)
1000.0000 mL | Freq: Once | INTRAVENOUS | Status: AC
  Administered 2015-11-16: 1000 mL via INTRAVENOUS

## 2015-11-16 MED ORDER — CHLORDIAZEPOXIDE HCL 25 MG PO CAPS: ORAL_CAPSULE | ORAL | Status: AC

## 2015-11-16 NOTE — ED Provider Notes (Signed)
CSN: [id number]     Arrival date & time 11/16/15  1319 History   First MD Initiated Contact with Patient 11/16/15 1523     No chief complaint on file.  HPI  [name] is an 54 y.o. male with h/o HTN, HLD, OSA, DM, alcohol abuse who presents to the ED for evaluation of AMS/LOC. He is accompanied by his wife who provides some of his history. Per his wife, she got home this afternoon ~1PM and found pt unresponsive on the floor. She tried to wake him up and he "grunted a few times" but did not wake up fully so she called EMS. States that the pt was not laying in blood or vomit and the only injury she saw was an abrasion on pt's L forearm. Pt's wife reports he has been hospitalized for AMS after drinking once before.  In the room, [name] is awake but still intoxicated. He is oriented x3. He reports he drank half of a fifth of bourbon this AM. When asked why he is here, he states "I'm an alcoholic." He denies any other drug or substance use. He denies any pain. Denies chest pain, SOB, headache.   Of note, pt was reportedly hypoxic to 87% on RA with EMS, improved to 100% with 2L O2 via [place]. In the room with me he is at 98% on RA. Pt's wife reports he has a history of severe OSA.  Past Medical History  Diagnosis Date  . Hypertension     Benign  . Hyperlipidemia   . GERD (gastroesophageal reflux disease)   . Allergy   . Obstructive sleep apnea   . Diabetes mellitus without complication (HCC) 2002    AODM  . Anemia     iron deficiency  . Alcohol abuse   . Hemorrhoids    Past Surgical History  Procedure Laterality Date  . Septoplasty  2002  . Knee surgery Left 2002    arthroscopic  . Eye surgery    . Esophagogastroduodenoscopy N/A   . Esophagogastroduodenoscopy N/A 08/03/2014    Procedure: ESOPHAGOGASTRODUODENOSCOPY (EGD);  Surgeon: Barrie FolkJohn C Hayes, MD;  [facility];  Service: Endoscopy;  Laterality: N/A;   Family History  Problem Relation Age of Onset  . Heart disease Mother     CAD, PVD  . Hypertension Mother   . Colon polyps Father   . Cancer Maternal Grandfather     lung  . Alcohol abuse Maternal Uncle    Social History  Substance Use Topics  . Smoking status: Never Smoker   . Smokeless tobacco: Never Used  . Alcohol Use: 3.6 - 4.8 oz/week    1-2 Cans of beer, 5-6 Shots of liquor per week     Comment: daily    Review of Systems  All other systems reviewed and are negative.     Allergies  Penicillins  Home Medications   Prior to Admission medications   Medication Sig Start Date End Date Taking? Authorizing Provider  allopurinol (ZYLOPRIM) 100 MG tablet Take 100 mg by mouth 2 (two) times daily.    Historical Provider, MD  aspirin 81 MG tablet Take 81 mg by mouth daily.    Historical Provider, MD  ferrous sulfate 325 (65 FE) MG tablet Take 1 tablet (325 mg total) by mouth 3 (three) times daily with meals. 08/04/14   Esperanza SheetsUlugbek N Buriev, MD  loratadine (CLARITIN) 10 MG tablet Take 10 mg by mouth daily.    Historical Provider, MD  mometasone (NASONEX) 50  MCG/ACT nasal spray Place 2 sprays into the nose daily.    Historical Provider, MD  Multiple Vitamin (MULTIVITAMIN) tablet Take 1 tablet by mouth daily.    Historical Provider, MD  Olmesartan-Amlodipine-HCTZ (TRIBENZOR) 40-10-25 MG TABS Take 1 tablet by mouth daily. 1/2 tablet daily.    Historical Provider, MD  omeprazole (PRILOSEC OTC) 20 MG tablet Take 2 tablets (40 mg total) by mouth daily. 08/04/14   Esperanza Sheets, MD  simvastatin (ZOCOR) 20 MG tablet Take 20 mg by mouth at bedtime.    Historical Provider, MD  traZODone (DESYREL) 50 MG tablet Take 1 tablet (50 mg total) by mouth at bedtime as needed and may repeat dose one time if needed for sleep. 08/09/14 10/09/14  Court Joy, PA-C   BP 141/83 mmHg  Pulse 99  Temp(Src) 97.5 F (36.4 C) (Oral)  Resp 18  SpO2 100% Physical Exam  Constitutional: He is oriented to person, place, and time. No distress.  HENT:  Head: Atraumatic.  Right Ear:  External ear normal.  Left Ear: External ear normal.  Nose: Nose normal.  Mouth/Throat: Uvula is midline, oropharynx is clear and moist and mucous membranes are normal. No oropharyngeal exudate.  Eyes: Conjunctivae and EOM are normal. Pupils are equal, round, and reactive to light.  Neck: Normal range of motion. Neck supple. No muscular tenderness present. No rigidity. No tracheal deviation present.  Cardiovascular: Regular rhythm, normal heart sounds and intact distal pulses.  Tachycardia present.   No murmur heard. Pulmonary/Chest: Effort normal and breath sounds normal. No accessory muscle usage. No respiratory distress. He has no wheezes.  Abdominal: Soft. Bowel sounds are normal. There is no tenderness. There is no guarding.  Musculoskeletal: Normal range of motion. He exhibits no edema.  Neurological: He is alert and oriented to person, place, and time. He has normal strength. No cranial nerve deficit or sensory deficit.  Skin: Skin is warm and dry. He is not diaphoretic.  L forearm with abrasion. Not actively bleeding. Nontender.   Psychiatric: He has a normal mood and affect.  Nursing note and vitals reviewed.   ED Course  Procedures (including critical care time) Labs Review Labs Reviewed  COMPREHENSIVE METABOLIC PANEL - Abnormal; Notable for the following:    Glucose, Bld 153 (*)    Calcium 8.6 (*)    All other components within normal limits  CBG MONITORING, ED - Abnormal; Notable for the following:    Glucose-Capillary 140 (*)    All other components within normal limits  CBC  ETHANOL  URINE RAPID DRUG SCREEN, HOSP PERFORMED  SALICYLATE LEVEL  ACETAMINOPHEN LEVEL  I-STAT TROPOININ, ED    Imaging Review No results found. I have personally reviewed and evaluated these images and lab results as part of my medical decision-making.   EKG Interpretation   Date/Time:  Friday November 16 2015 14:38:47 EST Ventricular Rate:  105 PR Interval:  140 QRS Duration: 78 QT  Interval:  341 QTC Calculation: 451 R Axis:   124 Text Interpretation:  Right and left arm electrode reversal,  interpretation assumes no reversal Sinus tachycardia Probable lateral  infarct, age indeterminate non specific t wave changes since last tracing  Confirmed by KNAPP  MD-J, JON ([id number]) on 11/16/2015 2:42:56 PM      MDM   Final diagnoses:  Alcohol intoxication, uncomplicated (HCC)    On my exam pt is A&Ox3 though still intoxicated. He is cooperative, non combative. Will work up to r/o other causes of AMS. Pt initially  hypoxic with EMS and on arrival but in the room with me he is maintaining good O2 sat on RA. Initial hypoxia likely secondary to OSA exacerbated by intoxication/respiratory depression. However will get CXR to r/o other pulmonary pathology or injury. Will get XR of L arm. Pt's neuro exam nonfocal with atraumatic head exam so will hold of on head CT now. CMP, CBG, CBC, EtOH, UDS, salicylate, acetaminophen, and i stat trop ordered.  Workup reveals ethanol in system but otherwise unremarkable. Pt maintaining SpO2 in mid to high 90s on RA. He is alert and oriented. He is accompanied by his wife and stable for discharge. Will give rx for Librium. Pt also requesting rx for antabuse. Return precautions given.    Carlene Coria, PA-C 11/16/15 2355  Richardean Canal, MD 11/17/15 [phone]

## 2015-11-16 NOTE — ED Notes (Signed)
Bed: [facility] Expected date:  Expected time:  Means of arrival:  Comments: EMS ETOH 

## 2015-11-16 NOTE — ED Notes (Signed)
Nurse drawing labs. 

## 2015-11-16 NOTE — Discharge Instructions (Signed)
You were seen in the ED today for alcohol intoxication. Your bloodwork shows alcohol in your system but is otherwise normal. Your heart rate is fast (tachycardic) which is likely due to dehydration. We gave you 1L of fluids in the ER but continue to drink plenty of fluids at home. We will give you two prescriptions. One is antabuse and one is librium. Please take as directed. Follow-up with your primary care provider within one week for further support and management.    Please obtain all of your results from medical records or have your doctors office obtain the results - share them with your doctor - you should be seen at your doctors office in the next 2 days. Call today to arrange your follow up. Take the medications as prescribed. Please review all of the medicines and only take them if you do not have an allergy to them. Please be aware that if you are taking birth control pills, taking other prescriptions, ESPECIALLY ANTIBIOTICS may make the birth control ineffective - if this is the case, either do not engage in sexual activity or use alternative methods of birth control such as condoms until you have finished the medicine and your family doctor says it is OK to restart them. If you are on a blood thinner such as COUMADIN, be aware that any other medicine that you take may cause the coumadin to either work too much, or not enough - you should have your coumadin level rechecked in next 7 days if this is the case.  ?  It is also a possibility that you have an allergic reaction to any of the medicines that you have been prescribed - Everybody reacts differently to medications and while MOST people have no trouble with most medicines, you may have a reaction such as nausea, vomiting, rash, swelling, shortness of breath. If this is the case, please stop taking the medicine immediately and contact your physician.  ?  You should return to the ER if you develop severe or worsening symptoms.

## 2015-11-16 NOTE — ED Notes (Signed)
Pt tried and was unable to get specimen.   Doesn't feel like he will be able to

## 2015-11-16 NOTE — ED Notes (Signed)
Patient now more alert, oriented to self, doesn't answer orientation to place, time. States "It was the booze. I had more than I should have". Unknown amount of bourbon. Denies pain, sob, N/V. Wife at bedside. PA-C at bedside.

## 2015-11-16 NOTE — ED Notes (Signed)
Patient presents from home via ems for AMS. Found lying right lateral on floor, unresponsive, per EMS patient reports ETOH use, history of same, no neuro deficits, 12 lead unremarkable, abrasions to left forearm.   Last VS: [id number], 138/82, 97hr, 100% 2L [place] (RA 87%)

## 2016-06-27 IMAGING — CR DG CHEST 2V
2 series · 2 of 2 positions shown · non-contrast
Comparison: None.

CLINICAL DATA: Patient arrives from home via ambulance for altered
mental status. Found on floor unresponsive. Reports ethanol abuse.
Also reports fall today. History of hypertension and diabetes.

EXAM:
CHEST  2 VIEW

[x chest ap]
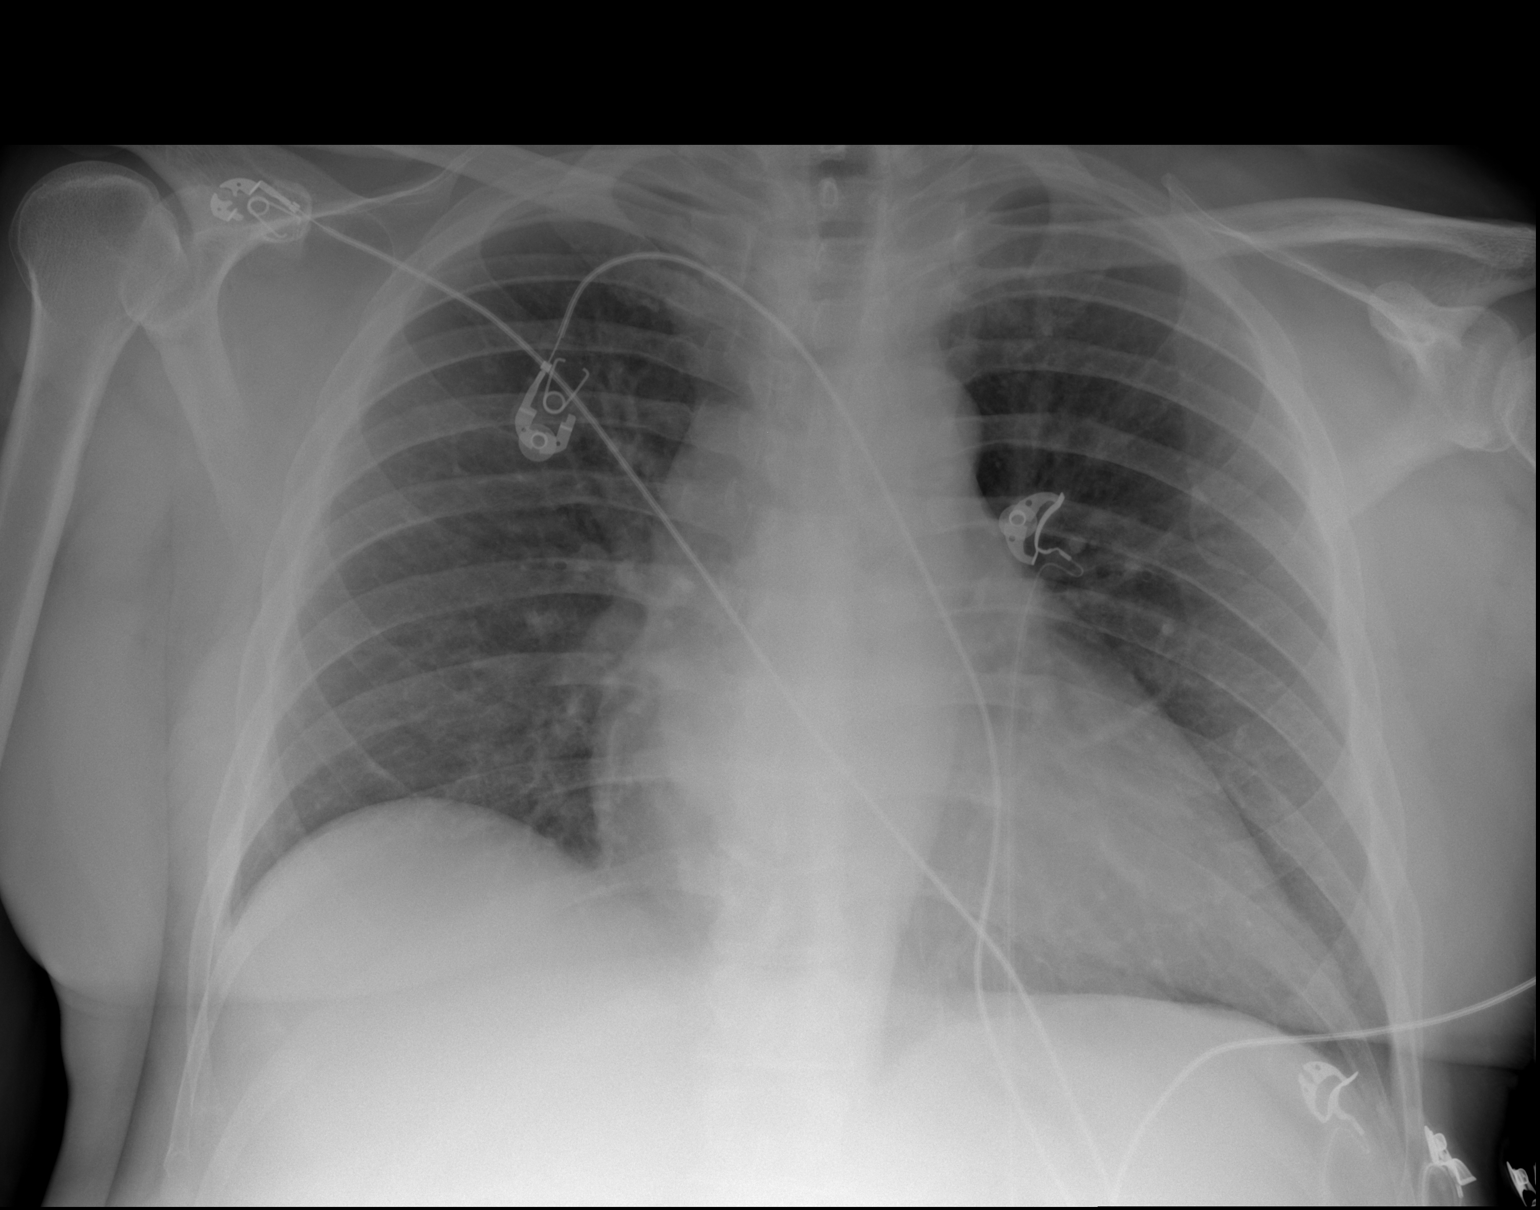

[w chest lat]
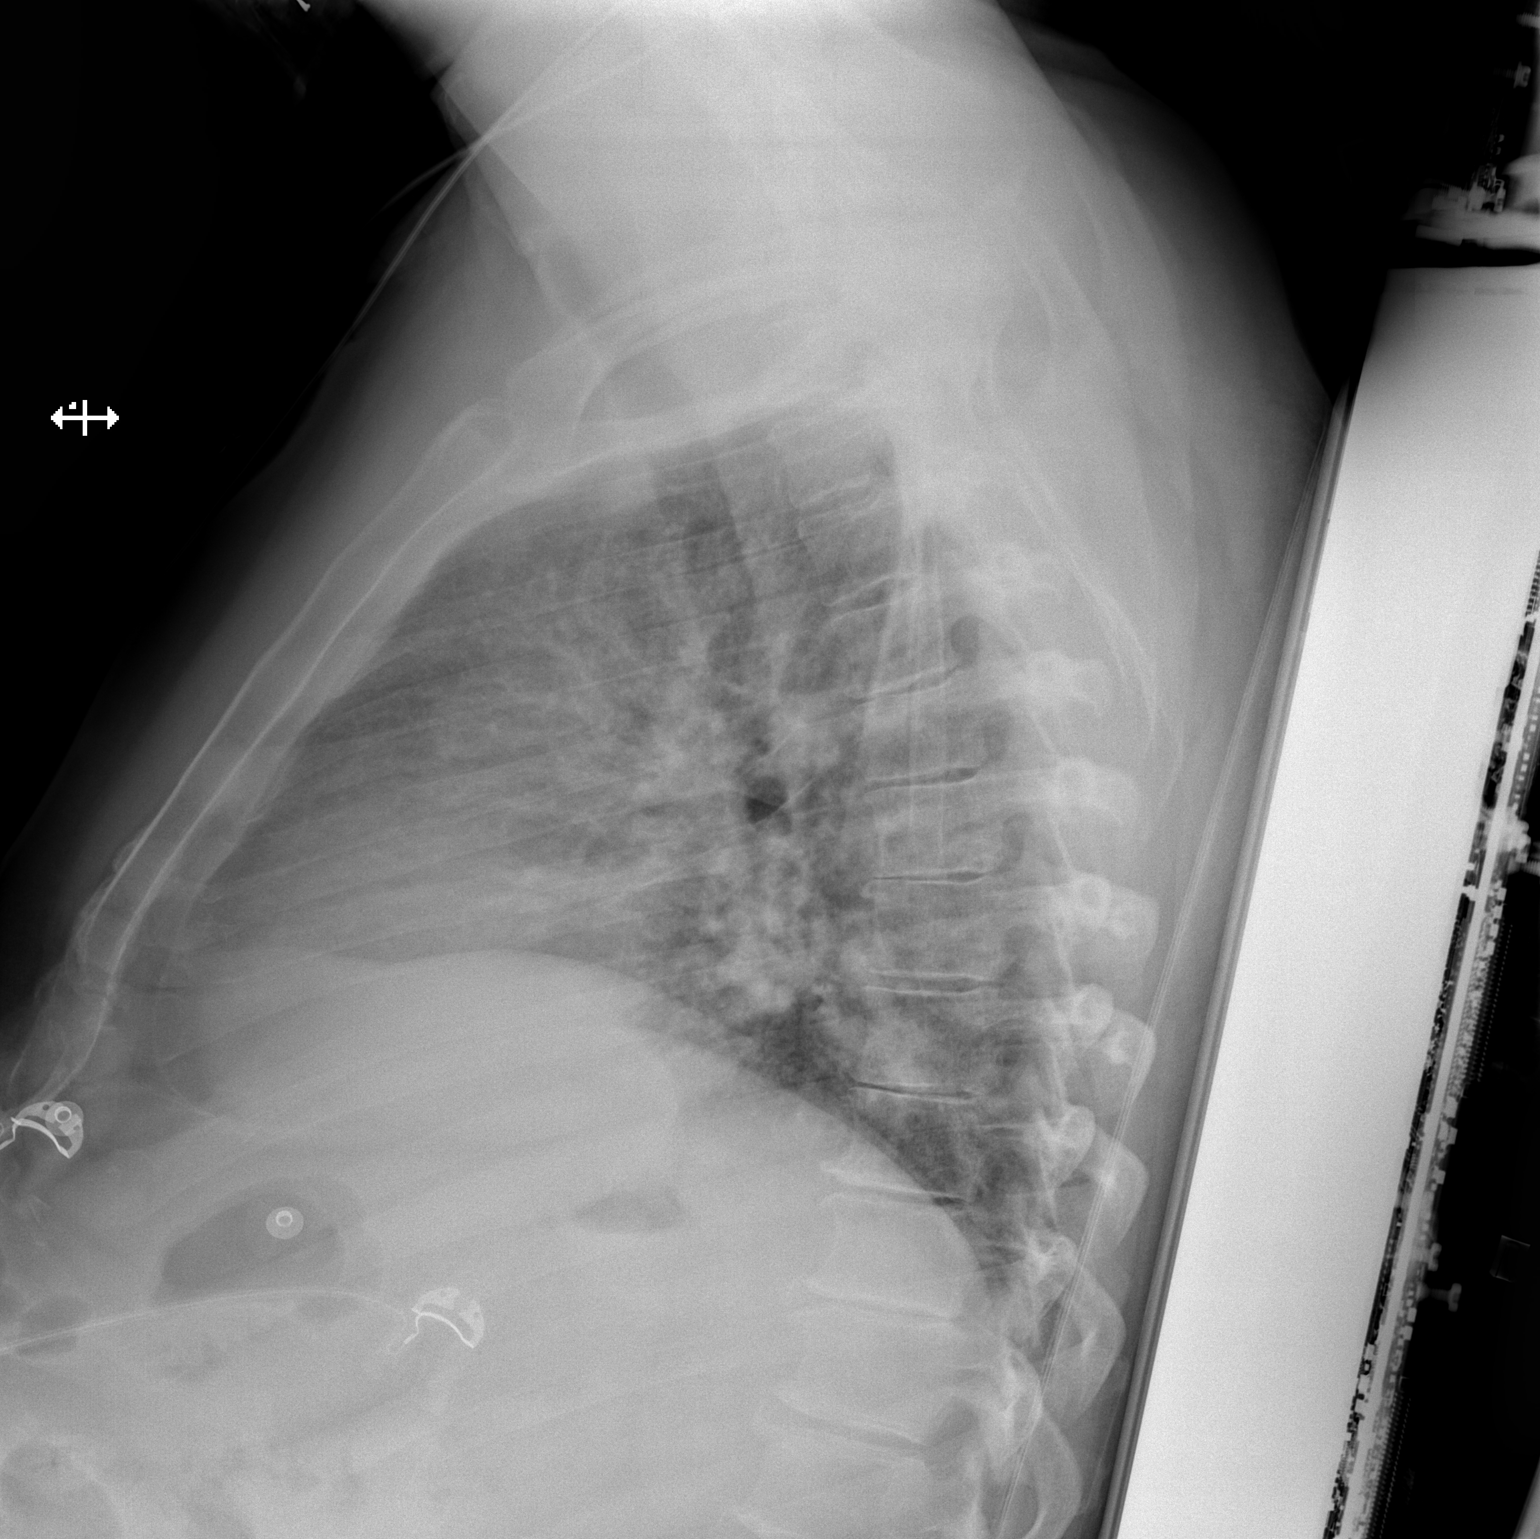

[2 of 2 positions shown; findings below may reference images not displayed]

FINDINGS: Cardiac silhouette normal in size and configuration. There are no
mediastinal or hilar masses or evidence of adenopathy.

Clear lungs.  No pleural effusion or pneumothorax.

Bony thorax is intact.
IMPRESSION: No active cardiopulmonary disease.

## 2016-06-27 IMAGING — CR DG FOREARM 2V*L*
2 series · 2 of 2 positions shown · non-contrast
Comparison: None.

CLINICAL DATA: Arm injury

EXAM:
LEFT FOREARM - 2 VIEW

[x forearm ap left]
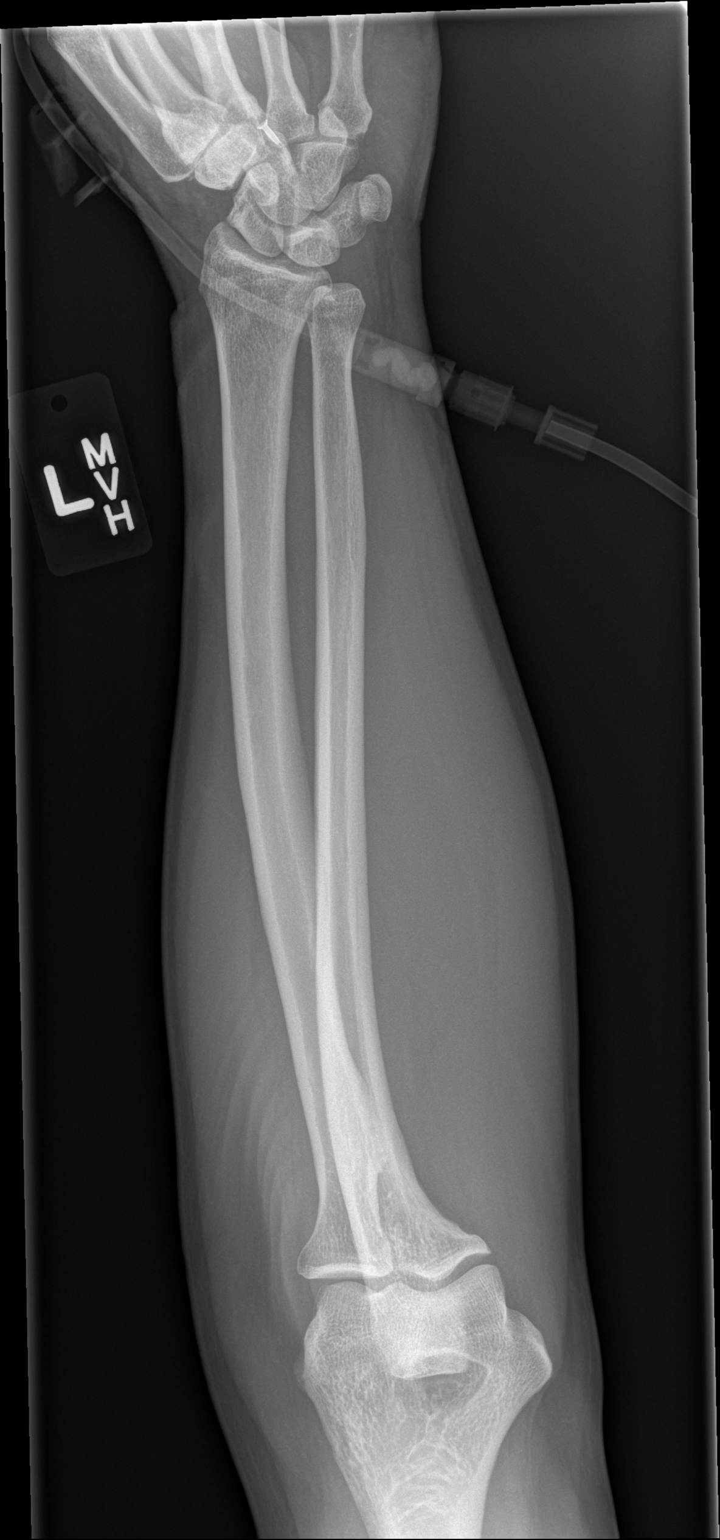

[x forearm lat left]
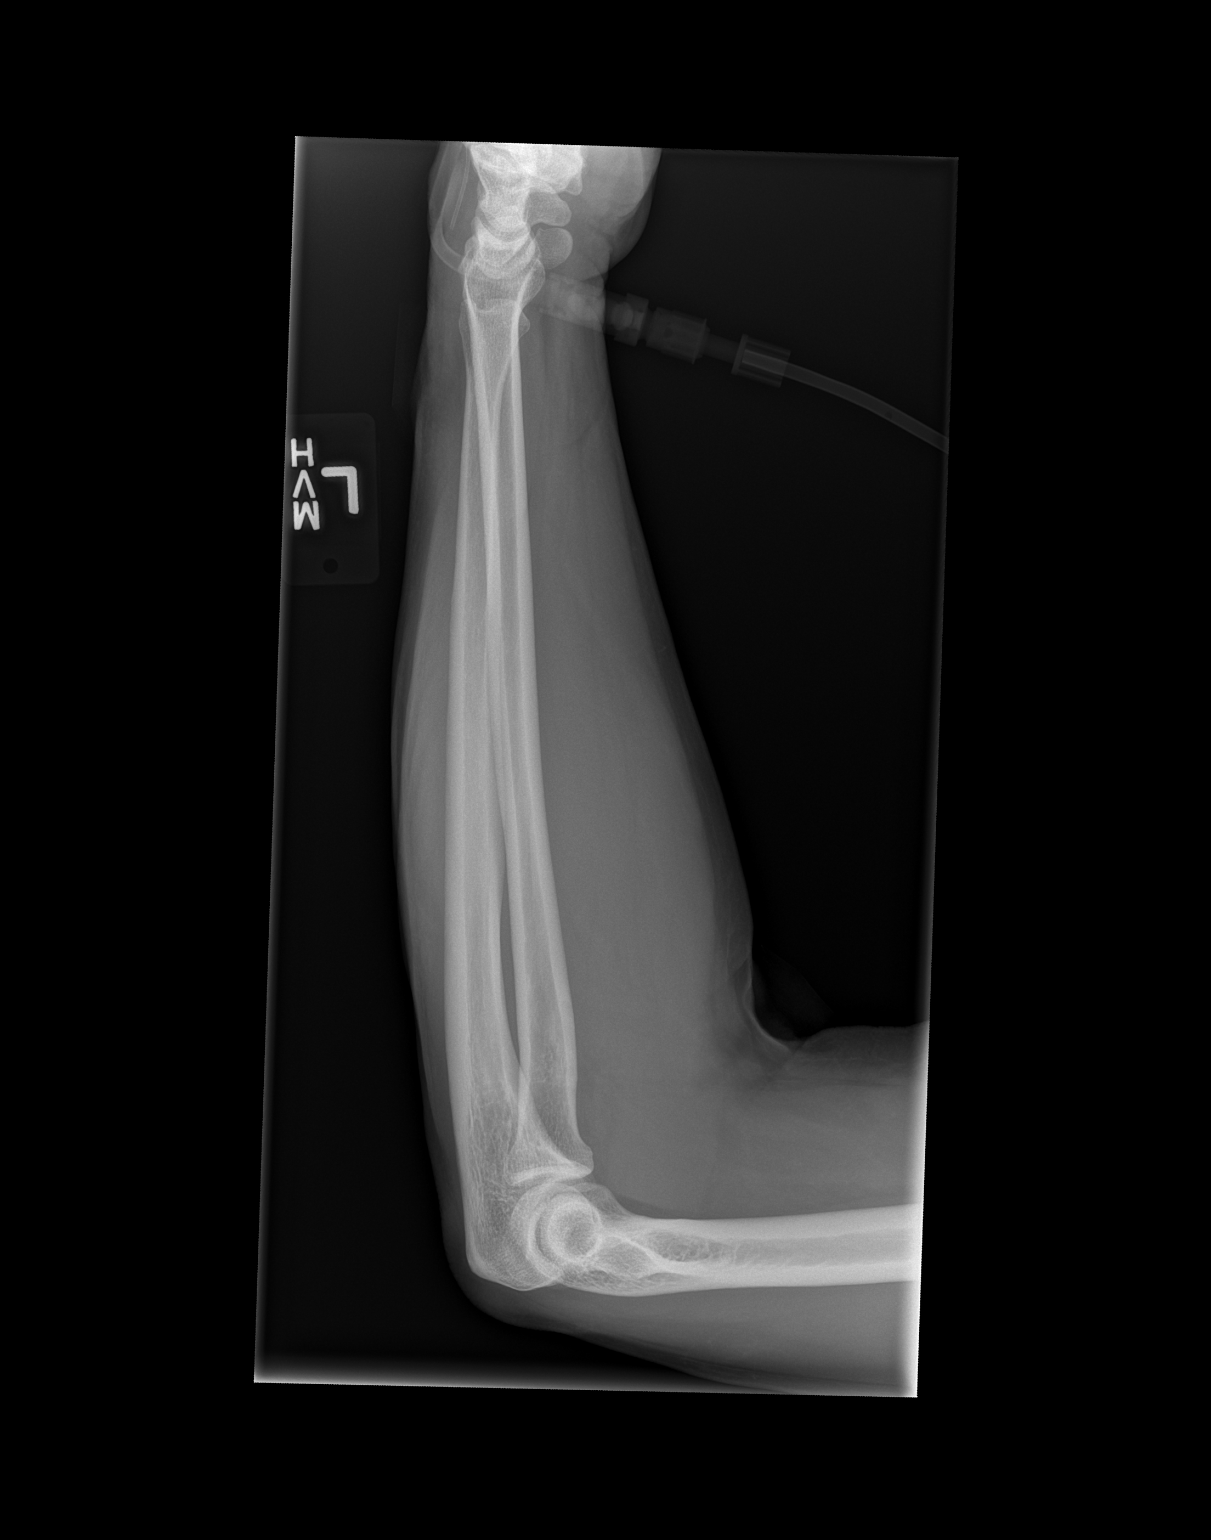

[2 of 2 positions shown; findings below may reference images not displayed]

FINDINGS: No fracture or dislocation is seen.

The joint spaces are preserved.

The visualized soft tissues are unremarkable.
IMPRESSION: No fracture or dislocation is seen.

## 2019-05-24 ENCOUNTER — Ambulatory Visit: Payer: Self-pay | Admitting: Surgery

## 2019-05-24 NOTE — H&P (Signed)
TRACY KINNER Documented: 05/24/2019 10:23 AM Location: [facility] Patient #: [phone] DOB: 07-06-1961 Married / Language: [place] / Race: White Male  History of Present Illness Ardeth Sportsman MD; 05/24/2019 11:20 AM) The patient is a 58 year old male who presents with hemorrhoids. Note for "Hemorrhoids": ` ` ` Patient sent for surgical consultation at the request of Dr Hinda Lenis GI  Chief Complaint: Prolapsing hemorrhoid with chronic intermittent bleeding ` ` The patient is a pleasant gentleman that has struggled hemorrhoids for many years, especially past 5 years. It has gotten to the point where at least 1 hemorrhoid pops out when he moves his bowels every morning. Usually has to push it back and a reduce it. Gets bleeding. Not as much pain as bleeding as a concern. Gets intermittent bright red blood of moderate volume. He's had anemia workup that is not really found much else aside from a benign-appearing Schatzki's ring rate has some dysphagia without. And dilated twice. Has some mild heartburn. Usually moves his bowels every morning. Fiber supplement. Had colonoscopy in February that showed some diverticulosis and obviously irritated hemorrhoids. Felt to be too large and too distal to try and manage with endoscopic banding. Surgical consultation recommended last fall. He is more ready to consider it since things have worsened over the past 6 months. He had no abdominal surgery. No prior anorectal intervention.  No personal nor family history of GI/colon cancer, inflammatory bowel disease, irritable bowel syndrome, allergy such as Celiac Sprue, dietary/dairy problems, colitis, ulcers nor gastritis. No recent sick contacts/gastroenteritis. No travel outside the country. No changes in diet. Had some dysphagia to solids consistent with the shots Estring. Underwent endoscopic dilatation 2. No more dysphagia to solids or liquids. No significant  heartburn or reflux. No hematochezia, hematemesis, coffee ground emesis. No evidence of prior gastric/peptic ulceration.  (Review of systems as stated in this history (HPI) or in the review of systems. Otherwise all other 12 point ROS are negative) ` ` `   Past Surgical History Renee Ramus, CMA; 05/24/2019 10:30 AM) Knee Surgery Right. Oral Surgery Vasectomy  Diagnostic Studies History Renee Ramus, CMA; 05/24/2019 10:30 AM) Colonoscopy within last year  Allergies Renee Ramus, CMA; 05/24/2019 10:31 AM) Penicillins  Medication History (Armen Ferguson, CMA; 05/24/2019 10:35 AM) traZODone HCl (  Tablet, Oral) Active. Depo-Testosterone ( /ML Solution, Intramuscular) Active. Wellbutrin SR (  Tablet ER 12HR, Oral) Active. Allopurinol (  Tablet, Oral) Active. PriLOSEC (  Capsule DR, Oral) Active. Rosuvastatin Calcium (  Tablet, Oral) Active. Meloxicam (7.5MG  Tablet, Oral) Active. Tribenzor (40-10-25MG  Tablet, Oral) Active. Multivitamins (Oral) Active. Iron (325 (65 Fe)MG Tablet, Oral) Active. Aspirin (  Tablet, Oral) Active. Sildenafil Citrate (  Tablet, Oral) Active. Nasonex (50MCG/ACT Suspension, Nasal) Active. Claritin (  Tablet, Oral) Active. Ipratropium Bromide (0.06% Solution, Nasal) Active. Medications Reconciled  Social History Renee Ramus, CMA; 05/24/2019 10:30 AM) Alcohol use Remotely quit alcohol use. Caffeine use Carbonated beverages, Tea. No drug use Tobacco use Never smoker.  Family History Renee Ramus, CMA; 05/24/2019 10:30 AM) Breast Cancer Sister. Heart Disease Mother. Hypertension Mother. Migraine Headache Daughter.  Other Problems Renee Ramus, CMA; 05/24/2019 10:30 AM) Alcohol Abuse Gastroesophageal Reflux Disease Hemorrhoids High blood pressure Hypercholesterolemia Sleep Apnea     Review of Systems (Armen Ferguson CMA; 05/24/2019 10:30 AM) General Not Present-  Appetite Loss, Chills, Fatigue, Fever, Night Sweats, Weight Gain and Weight Loss. Skin Not Present- Change in Wart/Mole, Dryness, Hives, Jaundice, New Lesions, Non-Healing Wounds, Rash and Ulcer. HEENT Present- Seasonal Allergies. Not Present- Earache, Hearing Loss, Hoarseness,  Nose Bleed, Oral Ulcers, Ringing in the Ears, Sinus Pain, Sore Throat, Visual Disturbances, Wears glasses/contact lenses and Yellow Eyes. Respiratory Present- Snoring. Not Present- Bloody sputum, Chronic Cough, Difficulty Breathing and Wheezing. Cardiovascular Not Present- Chest Pain, Difficulty Breathing Lying Down, Leg Cramps, Palpitations, Rapid Heart Rate, Shortness of Breath and Swelling of Extremities. Male Genitourinary Not Present- Blood in Urine, Change in Urinary Stream, Frequency, Impotence, Nocturia, Painful Urination, Urgency and Urine Leakage. Musculoskeletal Present- Joint Pain. Not Present- Back Pain, Joint Stiffness, Muscle Pain, Muscle Weakness and Swelling of Extremities. Neurological Not Present- Decreased Memory, Fainting, Headaches, Numbness, Seizures, Tingling, Tremor, Trouble walking and Weakness. Psychiatric Not Present- Anxiety, Bipolar, Change in Sleep Pattern, Depression, Fearful and Frequent crying. Endocrine Not Present- Cold Intolerance, Excessive Hunger, Hair Changes, Heat Intolerance, Hot flashes and New Diabetes. Hematology Not Present- Blood Thinners, Easy Bruising, Excessive bleeding, Gland problems, HIV and Persistent Infections.  Vitals (Armen Ferguson CMA; 05/24/2019 10:31 AM) 05/24/2019 10:30 AM Weight: 189.5 lb Height: 64in Body Surface Area: 1.91 m Body Mass Index: 32.53 kg/m  Temp.: 98.60F  Pulse: 94 (Regular)  P.OX: 97% (Room air) BP: 144/76 (Sitting, Left Arm, Standard)      Physical Exam Ardeth Sportsman(Sanja Elizardo C. Areta Terwilliger MD; 05/24/2019 10:46 AM)  General Mental Status-Alert. General Appearance-Not in acute distress, Not Sickly. Orientation-Oriented X3. Hydration-Well  hydrated. Voice-Normal.  Integumentary Global Assessment Upon inspection and palpation of skin surfaces of the - Axillae: non-tender, no inflammation or ulceration, no drainage. and Distribution of scalp and body hair is normal. General Characteristics Temperature - normal warmth is noted.  Head and Neck Head-normocephalic, atraumatic with no lesions or palpable masses. Face Global Assessment - atraumatic, no absence of expression. Neck Global Assessment - no abnormal movements, no bruit auscultated on the right, no bruit auscultated on the left, no decreased range of motion, non-tender. Trachea-midline. Thyroid Gland Characteristics - non-tender.  Eye Eyeball - Left-Extraocular movements intact, No Nystagmus. Eyeball - Right-Extraocular movements intact, No Nystagmus. Cornea - Left-No Hazy. Cornea - Right-No Hazy. Sclera/Conjunctiva - Left-No scleral icterus, No Discharge. Sclera/Conjunctiva - Right-No scleral icterus, No Discharge. Pupil - Left-Direct reaction to light normal. Pupil - Right-Direct reaction to light normal.  ENMT Ears Pinna - Left - no drainage observed, no generalized tenderness observed. Right - no drainage observed, no generalized tenderness observed. Nose and Sinuses External Inspection of the Nose - no destructive lesion observed. Inspection of the nares - Left - quiet respiration. Right - quiet respiration. Mouth and Throat Lips - Upper Lip - no fissures observed, no pallor noted. Lower Lip - no fissures observed, no pallor noted. Nasopharynx - no discharge present. Oral Cavity/Oropharynx - Tongue - no dryness observed. Oral Mucosa - no cyanosis observed. Hypopharynx - no evidence of airway distress observed.  Chest and Lung Exam Inspection Movements - Normal and Symmetrical. Accessory muscles - No use of accessory muscles in breathing. Palpation Palpation of the chest reveals - Non-tender. Auscultation Breath sounds - Normal  and Clear.  Cardiovascular Auscultation Rhythm - Regular. Murmurs & Other Heart Sounds - Auscultation of the heart reveals - No Murmurs and No Systolic Clicks.  Abdomen Inspection Inspection of the abdomen reveals - No Visible peristalsis and No Abnormal pulsations. Umbilicus - No Bleeding, No Urine drainage. Palpation/Percussion Palpation and Percussion of the abdomen reveal - Soft, Non Tender, No Rebound tenderness, No Rigidity (guarding) and No Cutaneous hyperesthesia. Note: Abdomen soft. Moderate supraumbilical diastases recti within abdominal wall. Nontender. Not distended. No umbilical or incisional hernias. No guarding.  Male Genitourinary Sexual  Maturity Tanner 5 - Adult hair pattern and Adult penile size and shape. Note: No inguinal hernias. Normal external genitalia. Epididymi, testes, and spermatic cords normal without any masses.  Rectal Note: Please refer to anoscopy.  Engorged large Grade 3 right posterior hemorrhoid easily prolapses out. Right anterior grade 2/3.  Peripheral Vascular Upper Extremity Inspection - Left - No Cyanotic nailbeds, Not Ischemic. Right - No Cyanotic nailbeds, Not Ischemic.  Neurologic Neurologic evaluation reveals -normal attention span and ability to concentrate, able to name objects and repeat phrases. Appropriate fund of knowledge , normal sensation and normal coordination. Mental Status Affect - not angry, not paranoid. Cranial Nerves-Normal Bilaterally. Gait-Normal.  Neuropsychiatric Mental status exam performed with findings of-able to articulate well with normal speech/language, rate, volume and coherence, thought content normal with ability to perform basic computations and apply abstract reasoning and no evidence of hallucinations, delusions, obsessions or homicidal/suicidal ideation.  Musculoskeletal Global Assessment Spine, Ribs and Pelvis - no instability, subluxation or laxity. Right Upper Extremity - no  instability, subluxation or laxity.  Lymphatic Head & Neck  General Head & Neck Lymphatics: Bilateral - Description - No Localized lymphadenopathy. Axillary  General Axillary Region: Bilateral - Description - No Localized lymphadenopathy. Femoral & Inguinal  Generalized Femoral & Inguinal Lymphatics: Left - Description - No Localized lymphadenopathy. Right - Description - No Localized lymphadenopathy.   Results Ardeth Sportsman MD; 05/24/2019 11:03 AM) Procedures  Name Value Date Hemorrhoids Procedure Internal exam: Internal Hemorroids ( non-bleeding) Internal Hemorrhoids (bleeding) prolapse Other: Grade 3 right posterior engorged hemorrhoid easily prolapses out. Right anterior at least grade 2. Possibly grade 3 and swollen. Left lateral pile more grade 1. ......  ...... Perianal skin clean with good hygiene. No pruritis ani. No pilonidal disease. No fissure. No abscess/fistula. Normal sphincter tone.  ...... No external hemorrhoids. No condyloma warts. ......  ......  Tolerates digital and anoscopic rectal exam. No rectal masses. Hemorrhoidal piles normal. ...... Exam done with assistance of male Medical Assistant in the room.  Performed: 05/24/2019 10:48 AM    Assessment & Plan Ardeth Sportsman MD; 05/24/2019 11:03 AM)  PROLAPSED INTERNAL HEMORRHOIDS, GRADE 3 (K64.2) Impression: History of hemorrhoids for many years. Now with chronically prolapsing hemorrhoid right posterior pile. Right anterior at least grade 2 and possibly grade 3 as well. Left lateral not is involved.. Intermittent severe bleeding. No evidence of other bleeding elsewhere by colonoscopy.  I think this will require hemorrhoidal ligation & pexing. Probable 1-2 pile hemorrhoidectomy to get under control. He has tried for many years to manage this nonoperatively. Still struggles. He is ready to consider surgery.  Current Plans ANOSCOPY, DIAGNOSTIC (73532) You are being scheduled for  surgery- Our schedulers will call you.  You should hear from our office's scheduling department within 5 working days about the location, date, and time of surgery. We try to make accommodations for patient's preferences in scheduling surgery, but sometimes the OR schedule or the surgeon's schedule prevents [place] from making those accommodations.  If you have not heard from our office [phone]) in 5 working days, call the office and ask for your surgeon's nurse.  If you have other questions about your diagnosis, plan, or surgery, call the office and ask for your surgeon's nurse.   INTERNAL BLEEDING HEMORRHOIDS (K64.8)   PROLAPSED INTERNAL HEMORRHOIDS, GRADE 2 (K64.1)  Current Plans Pt Education - [facility] Hemorrhoids (Dyann Goodspeed): discussed with patient and provided information. Pt Education - Pamphlet Given - The Hemorrhoid Book: discussed with patient and provided  information.  ENCOUNTER FOR PREOPERATIVE EXAMINATION FOR GENERAL SURGICAL PROCEDURE (Z01.818)  Current Plans Pt Education - [facility] Rectal Prep for Anorectal outpatient/office surgery: discussed with patient and provided information. Pt Education - [facility] Rectal Surgery HCI (Fabiola Mudgett): discussed with patient and provided information. Pt Education - [facility] Good Bowel Health (Kirsta Probert)  Ardeth Sportsman, MD, FACS, MASCRS Gastrointestinal and Minimally Invasive Surgery    [address], [address] [place], [place] [address] [phone] Main / Paging [phone] Fax
